# Patient Record
Sex: Male | Born: 1950
Health system: Southern US, Community
[De-identification: ages and names within clinical notes are randomized; demographics above are authoritative.]

## PROBLEM LIST (undated history)

## (undated) DIAGNOSIS — D699 Hemorrhagic condition, unspecified: Secondary | ICD-10-CM

## (undated) DIAGNOSIS — N400 Enlarged prostate without lower urinary tract symptoms: Secondary | ICD-10-CM

## (undated) DIAGNOSIS — Z9889 Other specified postprocedural states: Secondary | ICD-10-CM

## (undated) DIAGNOSIS — N200 Calculus of kidney: Secondary | ICD-10-CM

## (undated) DIAGNOSIS — N62 Hypertrophy of breast: Secondary | ICD-10-CM

## (undated) DIAGNOSIS — Z9289 Personal history of other medical treatment: Secondary | ICD-10-CM

## (undated) DIAGNOSIS — R112 Nausea with vomiting, unspecified: Secondary | ICD-10-CM

## (undated) DIAGNOSIS — I10 Essential (primary) hypertension: Secondary | ICD-10-CM

## (undated) DIAGNOSIS — E785 Hyperlipidemia, unspecified: Secondary | ICD-10-CM

## (undated) HISTORY — DX: Benign prostatic hyperplasia without lower urinary tract symptoms: N40.0

## (undated) HISTORY — DX: Hypertrophy of breast: N62

## (undated) HISTORY — DX: Calculus of kidney: N20.0

## (undated) HISTORY — DX: Personal history of other medical treatment: Z92.89

## (undated) HISTORY — DX: Essential (primary) hypertension: I10

## (undated) HISTORY — PX: CIRCUMCISION: SHX1350

## (undated) HISTORY — DX: Hyperlipidemia, unspecified: E78.5

## (undated) HISTORY — PX: MULTIPLE TOOTH EXTRACTIONS: SHX2053

---

## 1998-11-05 ENCOUNTER — Ambulatory Visit (HOSPITAL_BASED_OUTPATIENT_CLINIC_OR_DEPARTMENT_OTHER): Admission: RE | Admit: 1998-11-05 | Discharge: 1998-11-05 | Payer: Self-pay | Admitting: General Surgery

## 1999-12-01 ENCOUNTER — Encounter: Admission: RE | Admit: 1999-12-01 | Discharge: 1999-12-01 | Payer: Self-pay | Admitting: Family Medicine

## 1999-12-01 ENCOUNTER — Encounter: Payer: Self-pay | Admitting: Family Medicine

## 2009-03-03 ENCOUNTER — Encounter: Admission: RE | Admit: 2009-03-03 | Discharge: 2009-03-03 | Payer: Self-pay | Admitting: Family Medicine

## 2011-09-23 DIAGNOSIS — Z9289 Personal history of other medical treatment: Secondary | ICD-10-CM

## 2011-09-23 HISTORY — DX: Personal history of other medical treatment: Z92.89

## 2011-10-10 ENCOUNTER — Emergency Department (HOSPITAL_COMMUNITY)
Admission: EM | Admit: 2011-10-10 | Discharge: 2011-10-10 | Disposition: A | Discharge status: Home / Self Care | Payer: 59 | Attending: Emergency Medicine | Admitting: Emergency Medicine

## 2011-10-10 ENCOUNTER — Emergency Department (HOSPITAL_COMMUNITY): Payer: 59

## 2011-10-10 ENCOUNTER — Encounter (HOSPITAL_COMMUNITY): Payer: Self-pay | Admitting: Emergency Medicine

## 2011-10-10 DIAGNOSIS — N201 Calculus of ureter: Secondary | ICD-10-CM

## 2011-10-10 DIAGNOSIS — R109 Unspecified abdominal pain: Secondary | ICD-10-CM | POA: Insufficient documentation

## 2011-10-10 HISTORY — DX: Calculus of kidney: N20.0

## 2011-10-10 LAB — URINALYSIS, ROUTINE W REFLEX MICROSCOPIC
Glucose, UA: NEGATIVE mg/dL
Ketones, ur: 15 mg/dL — AB
Nitrite: NEGATIVE
Protein, ur: 100 mg/dL — AB
Specific Gravity, Urine: 1.031 — ABNORMAL HIGH (ref 1.005–1.030)
Urobilinogen, UA: 1 mg/dL (ref 0.0–1.0)
pH: 6 (ref 5.0–8.0)

## 2011-10-10 LAB — CBC
Hemoglobin: 12.9 g/dL — ABNORMAL LOW (ref 13.0–17.0)
MCH: 28.4 pg (ref 26.0–34.0)
Platelets: 262 10*3/uL (ref 150–400)
RBC: 4.54 MIL/uL (ref 4.22–5.81)
WBC: 9 10*3/uL (ref 4.0–10.5)

## 2011-10-10 LAB — URINE MICROSCOPIC-ADD ON

## 2011-10-10 LAB — BASIC METABOLIC PANEL
CO2: 24 mEq/L (ref 19–32)
Calcium: 9.2 mg/dL (ref 8.4–10.5)
Glucose, Bld: 140 mg/dL — ABNORMAL HIGH (ref 70–99)
Potassium: 3.5 mEq/L (ref 3.5–5.1)
Sodium: 139 mEq/L (ref 135–145)

## 2011-10-10 MED ORDER — ONDANSETRON 8 MG PO TBDP: 8.0000 mg | ORAL_TABLET | Freq: Three times a day (TID) | ORAL | Status: AC | PRN

## 2011-10-10 MED ORDER — KETOROLAC TROMETHAMINE 30 MG/ML IJ SOLN
30.0000 mg | Freq: Once | INTRAMUSCULAR | Status: AC
  Administered 2011-10-10: 30 mg via INTRAVENOUS
  Filled 2011-10-10: qty 1

## 2011-10-10 MED ORDER — SODIUM CHLORIDE 0.9 % IV BOLUS (SEPSIS)
1000.0000 mL | Freq: Once | INTRAVENOUS | Status: AC
  Administered 2011-10-10: 1000 mL via INTRAVENOUS

## 2011-10-10 MED ORDER — ONDANSETRON HCL 4 MG/2ML IJ SOLN
4.0000 mg | Freq: Once | INTRAMUSCULAR | Status: AC
  Administered 2011-10-10: 4 mg via INTRAVENOUS
  Filled 2011-10-10: qty 2

## 2011-10-10 MED ORDER — OXYCODONE-ACETAMINOPHEN 5-325 MG PO TABS: 1.0000 | ORAL_TABLET | ORAL | Status: AC | PRN

## 2011-10-10 MED ORDER — HYDROMORPHONE HCL PF 2 MG/ML IJ SOLN
2.0000 mg | Freq: Once | INTRAMUSCULAR | Status: AC
  Administered 2011-10-10: 2 mg via INTRAVENOUS
  Filled 2011-10-10: qty 1

## 2011-10-10 NOTE — ED Notes (Signed)
Resting quietly on stretcher - iv fluids infusing without difficulty; states pain now "tolerable" - awaiting CT scan results - pt aware of same

## 2011-10-10 NOTE — ED Notes (Signed)
Patient transported to CT 

## 2011-10-10 NOTE — ED Provider Notes (Signed)
History     CSN: [id number]  Arrival date & time 10/10/11  1610   First MD Initiated Contact with Patient 10/10/11 1659      Chief Complaint  Patient presents with  . Emesis  . Abdominal Pain     The history is provided by the patient.   the patient reports acute onset right-sided abdominal pain with radiation down towards his right groin his right testicle.  He denies testicular pain or penile discharge.  He reports nausea and vomiting without diarrhea.  He denies dysuria or urinary frequency.  He has had some urinary hesitancy today.  He denies fevers and chills.  His pain is moderate to severe at this time.  Nothing worsens the symptoms.  Nothing improves his symptoms.  Symptoms are constant.  Past Medical History  Diagnosis Date  . Kidney calculi   . Asthma     History reviewed. No pertinent past surgical history.  History reviewed. No pertinent family history.  History  Substance Use Topics  . Smoking status: Never Smoker   . Smokeless tobacco: Not on file  . Alcohol Use: Yes     occasionally      Review of Systems  Gastrointestinal: Positive for vomiting and abdominal pain.  All other systems reviewed and are negative.    Allergies  Shellfish allergy  Home Medications   Current Outpatient Rx  Name Route Sig Dispense Refill  . NAPROXEN SODIUM 220 MG PO TABS Oral Take 440 mg by mouth once.    Marland Kitchen PSEUDOEPHEDRINE-APAP-DM 44-010-27 MG/30ML PO LIQD Oral Take 30 mLs by mouth once.      BP 144/81  Pulse 74  Temp(Src) 98.6 F (37 C) (Oral)  Resp 20  SpO2 98%  Physical Exam  Nursing note and vitals reviewed. Constitutional: He is oriented to person, place, and time. He appears well-developed and well-nourished.  HENT:  Head: Normocephalic and atraumatic.  Eyes: EOM are normal.  Neck: Normal range of motion.  Cardiovascular: Normal rate, regular rhythm, normal heart sounds and intact distal pulses.   Pulmonary/Chest: Effort normal and breath sounds  normal. No respiratory distress.  Abdominal: Soft. He exhibits no distension. There is no tenderness.  Genitourinary:       No right CVA tenderness  Musculoskeletal: Normal range of motion.  Neurological: He is alert and oriented to person, place, and time.  Skin: Skin is warm and dry.  Psychiatric: He has a normal mood and affect. Judgment normal.    ED Course  Procedures (including critical care time)  Labs Reviewed  URINALYSIS, ROUTINE W REFLEX MICROSCOPIC - Abnormal; Notable for the following:    Color, Urine BROWN (*) BIOCHEMICALS MAY BE AFFECTED BY COLOR   APPearance TURBID (*)    Specific Gravity, Urine 1.031 (*)    Hgb urine dipstick LARGE (*)    Bilirubin Urine MODERATE (*)    Ketones, ur 15 (*)    Protein, ur 100 (*)    Leukocytes, UA SMALL (*)    All other components within normal limits  CBC - Abnormal; Notable for the following:    Hemoglobin 12.9 (*)    HCT 38.1 (*)    All other components within normal limits  BASIC METABOLIC PANEL - Abnormal; Notable for the following:    Glucose, Bld 140 (*)    GFR calc non Af Amer 74 (*)    GFR calc Af Amer 86 (*)    All other components within normal limits  URINE MICROSCOPIC-ADD ON - Abnormal;  Notable for the following:    Squamous Epithelial / LPF FEW (*)    Bacteria, UA FEW (*)    All other components within normal limits   Ct Abdomen Pelvis Wo Contrast  10/10/2011  *RADIOLOGY REPORT*  Clinical Data: Right flank and lower abdomen pain.  Right testicular pressure.  History of nephrolithiasis.  CT ABDOMEN AND PELVIS WITHOUT CONTRAST  Technique:  Multidetector CT imaging of the abdomen and pelvis was performed following the standard protocol without intravenous contrast.  Comparison: Renal ultrasound dated 03/03/2009.  CT report dated 12/01/1999.  Findings: Moderate dilatation of the right renal collecting system to the level of a 2 mm calculus in the proximal ureter.  2.6 cm upper pole right renal cyst.  Diffuse enlargement  of the right kidney compared to the left kidney with right perinephric soft tissue stranding.  Unremarkable left kidney, left ureter and urinary bladder.  Mildly enlarged prostate gland.  Normal appearing adrenal glands.  Unremarkable noncontrasted appearance of the liver, spleen, pancreas and gallbladder.  No gastrointestinal abnormalities or enlarged lymph nodes.  Normal appearing appendix in the upper right pelvis.  Clear lung bases.  Mild lumbar and lower thoracic spine degenerative changes.  IMPRESSION:  1.  2 mm proximal right ureteral calculus, causing moderate right hydronephrosis. 2.  Obstructive changes involving the right kidney. 3.  2.6 cm upper pole right renal cyst.  Original Report Authenticated By: Darrol Angel, M.D.   I personally reviewed the CT scan  1. Right ureteral stone       MDM  Patient with right-sided proximal UVJ stone.  His urine does not appear infected.  His kidney function is normal.  At this time 7:25 PM his pain is much better.  DC home with urology followup.         Lyanne Co, MD 10/10/11 [phone]

## 2011-10-10 NOTE — ED Notes (Signed)
Patient transported from CT 

## 2011-10-10 NOTE — Discharge Instructions (Signed)
Ureteral Colic (Kidney Stones) Ureteral colic is the result of a condition when kidney stones form inside the kidney. Once kidney stones are formed they may move into the tube that connects the kidney with the bladder (ureter). If this occurs, this condition may cause pain (colic) in the ureter.  CAUSES  Pain is caused by stone movement in the ureter and the obstruction caused by the stone. SYMPTOMS  The pain comes and goes as the ureter contracts around the stone. The pain is usually intense, sharp, and stabbing in character. The location of the pain may move as the stone moves through the ureter. When the stone is near the kidney the pain is usually located in the back and radiates to the belly (abdomen). When the stone is ready to pass into the bladder the pain is often located in the lower abdomen on the side the stone is located. At this location, the symptoms may mimic those of a urinary tract infection with urinary frequency. Once the stone is located here it often passes into the bladder and the pain disappears completely. TREATMENT   Your caregiver will provide you with medicine for pain relief.   You may require specialized follow-up X-rays.   The absence of pain does not always mean that the stone has passed. It may have just stopped moving. If the urine remains completely obstructed, it can cause loss of kidney function or even complete destruction of the involved kidney. It is your responsibility and in your interest that X-rays and follow-ups as suggested by your caregiver are completed. Relief of pain without passage of the stone can be associated with severe damage to the kidney, including loss of kidney function on that side.   If your stone does not pass on its own, additional measures may be taken by your caregiver to ensure its removal.  HOME CARE INSTRUCTIONS   Increase your fluid intake. Water is the preferred fluid since juices containing vitamin C may acidify the urine making  it less likely for certain stones (uric acid stones) to pass.   Strain all urine. A strainer will be provided. Keep all particulate matter or stones for your caregiver to inspect.   Take your pain medicine as directed.   Make a follow-up appointment with your caregiver as directed.   Remember that the goal is passage of your stone. The absence of pain does not mean the stone is gone. Follow your caregiver's instructions.   Only take over-the-counter or prescription medicines for pain, discomfort, or fever as directed by your caregiver.  SEEK MEDICAL CARE IF:   Pain cannot be controlled with the prescribed medicine.   You have a fever.   Pain continues for longer than your caregiver advises it should.   There is a change in the pain, and you develop chest discomfort or constant abdominal pain.   You feel faint or pass out.  MAKE SURE YOU:   Understand these instructions.   Will watch your condition.   Will get help right away if you are not doing well or get worse.  Document Released: 04/20/2005 Document Revised: 06/30/2011 Document Reviewed: 01/05/2011 Exit[employer] Patient Information 2012 [employer]. 

## 2011-10-10 NOTE — ED Notes (Signed)
Reports previous hx of kidney stones - states this pain similar to last stone

## 2011-10-10 NOTE — ED Notes (Signed)
Pt to ED for eval of vomiting, abd pain, pt c/o R testicle pain; has not been able to void since 0600 AM; mid to R abd pain;

## 2011-10-24 DIAGNOSIS — Z9289 Personal history of other medical treatment: Secondary | ICD-10-CM

## 2011-10-24 HISTORY — DX: Personal history of other medical treatment: Z92.89

## 2013-12-11 ENCOUNTER — Encounter: Payer: Self-pay | Admitting: Cardiology

## 2013-12-11 DIAGNOSIS — I10 Essential (primary) hypertension: Secondary | ICD-10-CM | POA: Insufficient documentation

## 2013-12-11 DIAGNOSIS — R011 Cardiac murmur, unspecified: Secondary | ICD-10-CM

## 2014-01-02 ENCOUNTER — Other Ambulatory Visit: Payer: Self-pay | Admitting: Interventional Cardiology

## 2014-02-17 ENCOUNTER — Ambulatory Visit (INDEPENDENT_AMBULATORY_CARE_PROVIDER_SITE_OTHER): Payer: 59 | Admitting: Interventional Cardiology

## 2014-02-17 ENCOUNTER — Encounter: Payer: Self-pay | Admitting: Interventional Cardiology

## 2014-02-17 VITALS — BP 123/72 | HR 67 | Ht 70.0 in | Wt 169.4 lb

## 2014-02-17 DIAGNOSIS — I10 Essential (primary) hypertension: Secondary | ICD-10-CM

## 2014-02-17 MED ORDER — AMLODIPINE BESYLATE 2.5 MG PO TABS: ORAL_TABLET | ORAL | Status: DC

## 2014-02-17 NOTE — Patient Instructions (Signed)
Your physician recommends that you continue on your current medications as directed. Please refer to the Current Medication list given to you today.  Amlodipine 2.5mg  has been refilled today with 90day supply  Your physician wants you to follow-up in: 1 year with Dr.Smith You will receive a reminder letter in the mail two months in advance. If you don't receive a letter, please call our office to schedule the follow-up appointment.

## 2014-02-17 NOTE — Progress Notes (Signed)
Patient ID: Jonathan Ramirez, male   DOB: July 13, 1951, 63 y.o.   MRN: [id number]    [address] [place], [place]  [id number] Phone: [phone] Fax:  [phone]  Date:  02/17/2014   ID:  Jonathan Ramirez, DOB July 13, 1951, MRN [id number]  PCP:  Maryelizabeth RowanEWEY,ELIZABETH, MD   ASSESSMENT:  1. Hypertension, controlled 2. Systolic flow murmur, physiologic murmur   PLAN:  1. No change in the current regimen of aerobic activity, salt restriction, and weight control. 2. Clinical followup in one year   SUBJECTIVE: Jonathan Ramirez is a 63 y.o. male who is doing well. He denies any cardiopulmonary complaints. He continues to enjoy bicycling, he just completed a tour across [place]. He does some competitive cycling.Marland Kitchen. He denies chest discomfort. No excessive dyspnea. No palpitations or syncope. No medication side effects.  Wt Readings from Last 3 Encounters:  02/17/14 169 lb 6.4 oz (76.839 kg)     Past Medical History  Diagnosis Date  . Kidney calculi   . Asthma   . Hyperlipidemia   . Hypertension   . BPH (benign prostatic hyperplasia)   . Nephrolithiasis   . Gynecomastia     eval by Dr. Abbey Chattersosenbower  . History of echocardiogram 3/13    no significant abn  . History of stress test 4/13    GXT- negative for ischemia    Current Outpatient Prescriptions  Medication Sig Dispense Refill  . amLODipine (NORVASC) 2.5 MG tablet take 1 tablet by mouth once daily  30 tablet  1  . tamsulosin (FLOMAX) 0.4 MG CAPS capsule Take 0.4 mg by mouth daily after supper.       No current facility-administered medications for this visit.    Allergies:    Allergies  Allergen Reactions  . Shellfish Allergy     Social History:  The patient  reports that he has never smoked. He does not have any smokeless tobacco history on file. He reports that he drinks alcohol. He reports that he does not use illicit drugs.   ROS:  Please see the history of present illness.      All other systems reviewed and negative.    OBJECTIVE: VS:  BP 123/72  Pulse 67  Ht 5\' 10"  (1.778 m)  Wt 169 lb 6.4 oz (76.839 kg)  BMI 24.31 kg/m2 Well nourished, well developed, in no acute distress, stable without obvious debility HEENT: normal Neck: JVD flat. Carotid bruit absent  Cardiac:  normal S1, S2; RRR; no murmur Lungs:  clear to auscultation bilaterally, no wheezing, rhonchi or rales Abd: soft, nontender, no hepatomegaly Ext: Edema absent. Pulses 2+ and symmetric Skin: warm and dry Neuro:  CNs 2-12 intact, no focal abnormalities noted  EKG:  Normal with vertical axis and prominent voltage. The patient is most likely represents physiologic hypertrophy. Blood pressure is under excellent control.       Signed, Darci NeedleHenry W. B. Daleiza Bacchi III, MD 02/17/2014 2:01 PM

## 2014-04-02 ENCOUNTER — Other Ambulatory Visit: Payer: Self-pay

## 2014-04-02 DIAGNOSIS — I10 Essential (primary) hypertension: Secondary | ICD-10-CM

## 2014-04-02 MED ORDER — AMLODIPINE BESYLATE 2.5 MG PO TABS: ORAL_TABLET | ORAL | Status: DC

## 2014-07-10 ENCOUNTER — Encounter: Payer: Self-pay | Admitting: Hematology & Oncology

## 2014-07-11 ENCOUNTER — Telehealth: Payer: Self-pay | Admitting: Hematology & Oncology

## 2014-07-11 NOTE — Telephone Encounter (Signed)
I spoke w NEW PATIENT today to remind them of their appointment with Dr. Ennever. Also, advised them to bring all medication bottles and insurance card information. ° °

## 2014-07-14 ENCOUNTER — Encounter: Payer: Self-pay | Admitting: Family

## 2014-07-14 ENCOUNTER — Ambulatory Visit: Payer: 59

## 2014-07-14 ENCOUNTER — Ambulatory Visit (HOSPITAL_BASED_OUTPATIENT_CLINIC_OR_DEPARTMENT_OTHER): Payer: 59 | Admitting: Lab

## 2014-07-14 ENCOUNTER — Ambulatory Visit (HOSPITAL_BASED_OUTPATIENT_CLINIC_OR_DEPARTMENT_OTHER): Payer: 59 | Admitting: Family

## 2014-07-14 VITALS — BP 127/68 | HR 70 | Temp 98.1°F | Resp 18 | Ht 67.0 in | Wt 176.0 lb

## 2014-07-14 DIAGNOSIS — Z862 Personal history of diseases of the blood and blood-forming organs and certain disorders involving the immune mechanism: Secondary | ICD-10-CM

## 2014-07-14 DIAGNOSIS — R972 Elevated prostate specific antigen [PSA]: Secondary | ICD-10-CM

## 2014-07-14 LAB — CBC WITH DIFFERENTIAL (CANCER CENTER ONLY)
BASO#: 0 10*3/uL (ref 0.0–0.2)
BASO%: 0.3 % (ref 0.0–2.0)
EOS ABS: 0.4 10*3/uL (ref 0.0–0.5)
EOS%: 6.4 % (ref 0.0–7.0)
HCT: 41.7 % (ref 38.7–49.9)
HEMOGLOBIN: 14.1 g/dL (ref 13.0–17.1)
LYMPH#: 1.3 10*3/uL (ref 0.9–3.3)
LYMPH%: 22.3 % (ref 14.0–48.0)
MCH: 29 pg (ref 28.0–33.4)
MCHC: 33.8 g/dL (ref 32.0–35.9)
MCV: 86 fL (ref 82–98)
MONO#: 0.6 10*3/uL (ref 0.1–0.9)
MONO%: 10.3 % (ref 0.0–13.0)
NEUT%: 60.7 % (ref 40.0–80.0)
NEUTROS ABS: 3.5 10*3/uL (ref 1.5–6.5)
PLATELETS: 235 10*3/uL (ref 145–400)
RBC: 4.87 10*6/uL (ref 4.20–5.70)
RDW: 12.9 % (ref 11.1–15.7)
WBC: 5.7 10*3/uL (ref 4.0–10.0)

## 2014-07-14 NOTE — Progress Notes (Signed)
Hematology/Oncology Consultation   Name: Windle Huebert      MRN: [id number]    Location: Room/bed info not found  Date: 07/14/2014 Time:1:30 PM   REFERRING PHYSICIAN: Jeannett Senior Dahlstedt  REASON FOR CONSULT: Pt states that he has a "bleeding disorder." Needs prostate ultrasound and biopsy but is worried about bleeding.    DIAGNOSIS: Awaiting lab results  HISTORY OF PRESENT ILLNESS: Mr. Hilyard is a very pleasant 63 yo African American male with an elevated PSA (5.78). He needs a prostate ultrasound and biopsy done but he has a history of bleeding that has his Dr concerned.  He was circumcised when he was 63 yo and bled profusely for several days required multiple transfusions. He was hospitalized for 2 weeks. He was followed monthly by Kendell Bane for 1 year. He was given a medication (but does not remember what it was) That caused the enamel to come off of his teeth. He then had all but 6 of his teeth pulled and bled again. He was in the hospital for 2 weeks but does not remember being transfused that time. He has had no other surgeries. He states that if he cuts himself it does take him a little while to clot.  None of his family has any bleeding or clotting disorder or sickle cell. His mother had colon cancer. 2 of his sisters had ovarian cancer and one has since passed away. His paternal grandfather and 2 uncles all passed away from prostate cancer. He has had an enlarged prostate for almost 20 years. He developed kidney stones last year and his urologist placed him on Fosamax.  He is on Norvasc for hypertension.  He is a bicycle rider and rides often in an organized league. He really enjoys this.  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 He served in [place] in the late 1960's.  He does not smoke and only drinks occassionally.  His appetite is good and he stays hydrated. His weight is stable.    He denies fever, chills, n/v, cough, rash, headache, dizziness, SOB, chest  pain, palpitations, abdominal pain, constipation, diarrhea, blood in urine or stool.  No swelling, tenderness, numbness or tingling in his extremities.   ROS: All other 10 point review of systems is negative.   PAST MEDICAL HISTORY:   Past Medical History  Diagnosis Date  . Kidney calculi   . Asthma   . Hyperlipidemia   . Hypertension   . BPH (benign prostatic hyperplasia)   . Nephrolithiasis   . Gynecomastia     eval by Dr. Abbey Chatters  . History of echocardiogram 3/13    no significant abn  . History of stress test 4/13    GXT- negative for ischemia    ALLERGIES: Allergies  Allergen Reactions  . Shellfish Allergy       MEDICATIONS:  Current Outpatient Prescriptions on File Prior to Visit  Medication Sig Dispense Refill  . amLODipine (NORVASC) 2.5 MG tablet take 1 tablet by mouth once daily 90 tablet 3  . tamsulosin (FLOMAX) 0.4 MG CAPS capsule Take 0.4 mg by mouth daily after supper.     No current facility-administered medications on file prior to visit.     PAST SURGICAL HISTORY No past surgical history on file.  FAMILY HISTORY: Family History  Problem Relation Age of Onset  . CAD Father     stent  . COPD Father   . Colon cancer Mother   . Heart disease Mother  SOCIAL HISTORY:  reports that he has never smoked. He does not have any smokeless tobacco history on file. He reports that he drinks alcohol. He reports that he does not use illicit drugs.  PERFORMANCE STATUS: The patient's performance status is 0 - Asymptomatic  PHYSICAL EXAM: Most Recent Vital Signs: There were no vitals taken for this visit. BP 127/68 mmHg  Pulse 70  Temp(Src) 98.1 F (36.7 C) (Oral)  Resp 18  Ht 5\' 7"  (1.702 m)  Wt 176 lb (79.833 kg)  BMI 27.56 kg/m2  General Appearance:    Alert, cooperative, no distress, appears stated age  Head:    Normocephalic, without obvious abnormality, atraumatic  Eyes:    PERRL, conjunctiva/corneas clear, EOM's intact, fundi    benign,  both eyes             Throat:   Lips, mucosa, and tongue normal; teeth and gums normal  Neck:   Supple, symmetrical, trachea midline, no adenopathy;       thyroid:  No enlargement/tenderness/nodules; no carotid   bruit or JVD  Back:     Symmetric, no curvature, ROM normal, no CVA tenderness  Lungs:     Clear to auscultation bilaterally, respirations unlabored  Chest wall:    No tenderness or deformity  Heart:    Regular rate and rhythm, S1 and S2 normal, no murmur, rub   or gallop  Abdomen:     Soft, non-tender, bowel sounds active all four quadrants,    no masses, no organomegaly        Extremities:   Extremities normal, atraumatic, no cyanosis or edema  Pulses:   2+ and symmetric all extremities  Skin:   Skin color, texture, turgor normal, no rashes or lesions  Lymph nodes:   Cervical, supraclavicular, and axillary nodes normal  Neurologic:   CNII-XII intact. Normal strength, sensation and reflexes      throughout    LABORATORY DATA:  Results for orders placed or performed in visit on 07/14/14 (from the past 48 hour(s))  CBC with Differential [facility]     Status: None   Collection Time: 07/14/14  1:00 PM  Result Value Ref Range   WBC 5.7 4.0 - 10.0 10e3/uL   RBC 4.87 4.20 - 5.70 10e6/uL   HGB 14.1 13.0 - 17.1 g/dL   HCT 16.141.7 09.638.7 - 04.549.9 %   MCV 86 82 - 98 fL   MCH 29.0 28.0 - 33.4 pg   MCHC 33.8 32.0 - 35.9 g/dL   RDW 40.912.9 81.111.1 - 91.415.7 %   Platelets 235 145 - 400 10e3/uL   NEUT# 3.5 1.5 - 6.5 10e3/uL   LYMPH# 1.3 0.9 - 3.3 10e3/uL   MONO# 0.6 0.1 - 0.9 10e3/uL   Eosinophils Absolute 0.4 0.0 - 0.5 10e3/uL   BASO# 0.0 0.0 - 0.2 10e3/uL   NEUT% 60.7 40.0 - 80.0 %   LYMPH% 22.3 14.0 - 48.0 %   MONO% 10.3 0.0 - 13.0 %   EOS% 6.4 0.0 - 7.0 %   BASO% 0.3 0.0 - 2.0 %      RADIOGRAPHY: No results found.     PATHOLOGY: None  ASSESSMENT/PLAN: [name] is a very pleasant 63 yo African American male with an elevated PSA (5.78). He needs a prostate ultrasound and  biopsy done but he has a history of bleeding that has his urologist concerned.  His CBC today was normal. Platelets 235. He has had no bleeding.  We will see what the  rest of his labs show.  We will contact him with the results so urology will know how to proceed with the biopsy.   All questions were answered. He knows to call the clinic with any problems, questions or concerns. We can certainly see him much sooner if necessary.  The patient was discussed with and also seen by Dr. Myna HidalgoEnnever and he is in agreement with the aforementioned.   [facility],Kryssa Risenhoover M    Addendum:    I saw and examined the patient with Miller Edgington. It is very hard to tell whether or not there are any coagulation issues. Unfortunately, we don't have a lot to go on.  I am very interested in the fact that despite him having this bleeding issue when he was younger, he still made into [employer].  He does not bruise. He does not have any spontaneous bleeding.  Under the microscope, his platelets look okay. They are well granulated. He has normal white cells and red cells.  There is no family history of bleeding.  He has had some teeth removed. From what he tells me, there was no bleeding that was excessive.  He does have a piercing into his nose. There was no bleeding with this.  It is possible that he may have von Willebrand's disease. We will have to await the results of his coagulation studies.  I would not think that he is hemophiliac. I would think if he had this, he would not have made it into [employer] service [place] in [place].  We want to make sure that we are aggressive in identifying any bleeding disorder. With prostate biopsies, bleeding can certainly be quite brisk and we will want to make sure that bleeding is a minimum because of the need for accurate biopsy specimens.  We will certainly let Dr. Lattie Hawallstedt know what is going on so that he can plan for the biopsy. I think the biopsy will be done in  January.  [name] is a very interesting man. It was found talking to him. We spent about 45 minutes with him.  Cindee LamePete

## 2014-07-19 LAB — PROTHROMBIN TIME
INR: 1.39 (ref <1.50)
Prothrombin Time: 17.1 seconds — ABNORMAL HIGH (ref 11.6–15.2)

## 2014-07-19 LAB — VON WILLEBRAND PANEL
COAGULATION FACTOR VIII: 57 % — AB (ref 73–140)
Ristocetin Co-factor, Plasma: 93 % (ref 42–200)
Von Willebrand Antigen, Plasma: 152 % (ref 50–217)

## 2014-07-19 LAB — APTT: APTT: 41 s — AB (ref 24–37)

## 2014-07-22 ENCOUNTER — Telehealth: Payer: Self-pay | Admitting: Hematology & Oncology

## 2014-07-22 ENCOUNTER — Other Ambulatory Visit: Payer: Self-pay | Admitting: Family

## 2014-07-22 DIAGNOSIS — D699 Hemorrhagic condition, unspecified: Secondary | ICD-10-CM

## 2014-07-22 NOTE — Telephone Encounter (Signed)
Patient called back and sch apt for 07/23/14

## 2014-07-22 NOTE — Telephone Encounter (Signed)
Per order to sch patient for lab apt in one week.  I called patient today and left a message for patient to call office back to sch labs.

## 2014-07-23 ENCOUNTER — Ambulatory Visit (HOSPITAL_BASED_OUTPATIENT_CLINIC_OR_DEPARTMENT_OTHER): Payer: 59 | Admitting: Lab

## 2014-07-23 DIAGNOSIS — R972 Elevated prostate specific antigen [PSA]: Secondary | ICD-10-CM

## 2014-07-23 DIAGNOSIS — D699 Hemorrhagic condition, unspecified: Secondary | ICD-10-CM

## 2014-07-30 LAB — FACTOR 10 ASSAY: FACTOR X ACTIVITY: 98 % (ref 72–134)

## 2014-07-30 LAB — MIXING STUDY DILUTIONS, PT
PATIENT-4/1, IMMEDIATE MIX-PT: 14.4 s
Patient-1/1, Immediate Mix-PT: 13.9 seconds

## 2014-07-30 LAB — PTT FACTOR INHIBITOR (MIXING STUDY): PTT: 34 s (ref 24–37)

## 2014-07-30 LAB — PT FACTOR INHIBITOR (MIXING STUDY): Protime: 15.3 seconds — ABNORMAL HIGH (ref 11.6–15.2)

## 2014-08-04 ENCOUNTER — Telehealth: Payer: Self-pay | Admitting: Hematology & Oncology

## 2014-08-04 NOTE — Telephone Encounter (Signed)
Pt called was calling messages left on phone from December. He did get lab from 12-30, but never got the results. I transferred to RN vm.

## 2014-08-06 ENCOUNTER — Other Ambulatory Visit: Payer: Self-pay | Admitting: Hematology & Oncology

## 2014-08-06 DIAGNOSIS — D689 Coagulation defect, unspecified: Secondary | ICD-10-CM

## 2014-08-07 ENCOUNTER — Telehealth: Payer: Self-pay | Admitting: Hematology & Oncology

## 2014-08-07 NOTE — Telephone Encounter (Signed)
Left pt message with 2-3 appointment

## 2014-08-13 ENCOUNTER — Ambulatory Visit: Payer: Self-pay | Admitting: Urology

## 2014-08-13 DIAGNOSIS — R972 Elevated prostate specific antigen [PSA]: Secondary | ICD-10-CM | POA: Diagnosis not present

## 2014-08-13 DIAGNOSIS — D689 Coagulation defect, unspecified: Secondary | ICD-10-CM | POA: Diagnosis present

## 2014-08-15 ENCOUNTER — Other Ambulatory Visit: Payer: Self-pay | Admitting: Urology

## 2014-08-15 ENCOUNTER — Other Ambulatory Visit (HOSPITAL_COMMUNITY)
Admission: RE | Admit: 2014-08-15 | Discharge: 2014-08-15 | Disposition: A | Discharge status: Home / Self Care | Payer: Medicare Other | Source: Ambulatory Visit | Attending: Hematology & Oncology | Admitting: Hematology & Oncology

## 2014-08-15 DIAGNOSIS — D689 Coagulation defect, unspecified: Secondary | ICD-10-CM | POA: Diagnosis present

## 2014-08-15 DIAGNOSIS — Z7901 Long term (current) use of anticoagulants: Secondary | ICD-10-CM | POA: Insufficient documentation

## 2014-08-15 LAB — ABO/RH
ABO/RH(D): O NEG
ABO/RH(D): O NEG

## 2014-08-18 ENCOUNTER — Encounter (HOSPITAL_COMMUNITY): Payer: Self-pay

## 2014-08-18 ENCOUNTER — Ambulatory Visit (HOSPITAL_COMMUNITY)
Admission: RE | Admit: 2014-08-18 | Discharge: 2014-08-18 | Disposition: A | Discharge status: Home / Self Care | Payer: Medicare Other | Source: Ambulatory Visit | Attending: Urology | Admitting: Urology

## 2014-08-18 DIAGNOSIS — R972 Elevated prostate specific antigen [PSA]: Secondary | ICD-10-CM | POA: Insufficient documentation

## 2014-08-18 DIAGNOSIS — D689 Coagulation defect, unspecified: Secondary | ICD-10-CM | POA: Insufficient documentation

## 2014-08-18 HISTORY — DX: Nausea with vomiting, unspecified: R11.2

## 2014-08-18 HISTORY — DX: Hemorrhagic condition, unspecified: D69.9

## 2014-08-18 HISTORY — DX: Other specified postprocedural states: Z98.890

## 2014-08-18 MED ORDER — SODIUM CHLORIDE 0.9 % IV SOLN
Freq: Once | INTRAVENOUS | Status: AC
  Administered 2014-08-18: 12:00:00 via INTRAVENOUS

## 2014-08-18 NOTE — Discharge Instructions (Signed)
Follow Pink Blood Product Sheet. Remember to notify MD of Fever over 100.5, chills, Rash,Hives, Shortness of breath or other problems

## 2014-08-19 LAB — PREPARE FRESH FROZEN PLASMA
UNIT DIVISION: 0
Unit division: 0

## 2014-08-27 ENCOUNTER — Other Ambulatory Visit (HOSPITAL_BASED_OUTPATIENT_CLINIC_OR_DEPARTMENT_OTHER): Payer: Medicare Other | Admitting: Lab

## 2014-08-27 ENCOUNTER — Encounter: Payer: Self-pay | Admitting: Hematology & Oncology

## 2014-08-27 ENCOUNTER — Ambulatory Visit (HOSPITAL_BASED_OUTPATIENT_CLINIC_OR_DEPARTMENT_OTHER): Payer: Medicare Other | Admitting: Hematology & Oncology

## 2014-08-27 VITALS — BP 126/69 | HR 64 | Temp 98.2°F | Resp 18 | Ht 69.0 in | Wt 179.0 lb

## 2014-08-27 DIAGNOSIS — D689 Coagulation defect, unspecified: Secondary | ICD-10-CM

## 2014-08-27 DIAGNOSIS — Z862 Personal history of diseases of the blood and blood-forming organs and certain disorders involving the immune mechanism: Secondary | ICD-10-CM

## 2014-08-27 LAB — CBC WITH DIFFERENTIAL (CANCER CENTER ONLY)
BASO#: 0 10*3/uL (ref 0.0–0.2)
BASO%: 0.8 % (ref 0.0–2.0)
EOS%: 8 % — ABNORMAL HIGH (ref 0.0–7.0)
Eosinophils Absolute: 0.4 10*3/uL (ref 0.0–0.5)
HCT: 41.3 % (ref 38.7–49.9)
HGB: 14 g/dL (ref 13.0–17.1)
LYMPH#: 1.1 10*3/uL (ref 0.9–3.3)
LYMPH%: 22.8 % (ref 14.0–48.0)
MCH: 28.9 pg (ref 28.0–33.4)
MCHC: 33.9 g/dL (ref 32.0–35.9)
MCV: 85 fL (ref 82–98)
MONO#: 0.6 10*3/uL (ref 0.1–0.9)
MONO%: 11.6 % (ref 0.0–13.0)
NEUT#: 2.8 10*3/uL (ref 1.5–6.5)
NEUT%: 56.8 % (ref 40.0–80.0)
PLATELETS: 256 10*3/uL (ref 145–400)
RBC: 4.84 10*6/uL (ref 4.20–5.70)
RDW: 13.2 % (ref 11.1–15.7)
WBC: 5 10*3/uL (ref 4.0–10.0)

## 2014-08-27 NOTE — Progress Notes (Signed)
Hematology and Oncology Follow Up Visit  Jonathan ColeDelacy Ramirez [id number] 04-Jul-1951 64 y.o. 08/27/2014   Principle Diagnosis:   Coagulopathy- undefined  Current Therapy:    Observation     Interim History:  Mr.  Jonathan Ramirez is back for follow-up. We saw him initially because of the history of bleeding. He was to have a prostate biopsy because the possibility of prostate cancer.  We first saw him, his pro time was elevated. His PTT was slightly elevated.  His factor VIII level was minimally depressed at 57%.  We did do a mixing study on him. The mixing study normalized. As such, he may have had a factor deficiency.  His factor X level was 98%.  I spoke to his urologist, Dr. Retta Dionesahlstedt, and told him that he probably would need some FFP prior to the biopsy.  He had a prostate biopsy done on January 25. He got 2 units of FFP. There is very little bleeding.  Thank you, his prostate biopsy came back negative. He had 1 out of 12 biopsies which showed a high grade intraepithelial neoplasm. This was not prostate cancer.  He does not have to go back to see Dr. Retta Dionesahlstedt until July.  He is still working. He is not having any bleeding issues. He wants to go on a bike ride in [place] in March.  Medications:  Current outpatient prescriptions:  .  amLODipine (NORVASC) 2.5 MG tablet, take 1 tablet by mouth once daily, Disp: 90 tablet, Rfl: 3 .  Multiple Vitamins-Minerals (MULTIVITAMIN WITH MINERALS) tablet, Take 1 tablet by mouth daily., Disp: , Rfl:  .  tamsulosin (FLOMAX) 0.4 MG CAPS capsule, Take 0.4 mg by mouth daily after supper., Disp: , Rfl:   Allergies:  Allergies  Allergen Reactions  . Shellfish Allergy     Past Medical History, Surgical history, Social history, and Family History were reviewed and updated.  Review of Systems: As above  Physical Exam:  height is 5\' 9"  (1.753 m) and weight is 179 lb (81.194 kg). His oral temperature is 98.2 F (36.8 C). His blood pressure is 126/69  and his pulse is 64. His respiration is 18.   Well-developed and well-nourished white gentleman in no obvious distress. Head and neck exam shows no ocular or oral lesions. There are no palpable cervical or supraclavicular lymph nodes. Lungs are clear. Cardiac exam regular rate and rhythm with no murmurs, rubs or bruits. Abdomen is soft. He has good bowel sounds. There is no fluid wave. There is no palpable liver or spleen tip. Back exam shows no tenderness over the spine, ribs or hips. Extremities shows no clubbing, cyanosis or edema. He has good range of motion of his joints. Neurological exam shows no focal neurological deficits. Skin exam no rashes, ecchymoses or petechia.  Lab Results  Component Value Date   WBC 5.0 08/27/2014   HGB 14.0 08/27/2014   HCT 41.3 08/27/2014   MCV 85 08/27/2014   PLT 256 08/27/2014     Chemistry      Component Value Date/Time   NA 139 10/10/2011 1712   K 3.5 10/10/2011 1712   CL 103 10/10/2011 1712   CO2 24 10/10/2011 1712   BUN 12 10/10/2011 1712   CREATININE 1.05 10/10/2011 1712      Component Value Date/Time   CALCIUM 9.2 10/10/2011 1712         Impression and Plan: Mr. Jonathan Ramirez is 64 year old gentleman. He has a bleeding disorder. So far, are studies have been inconclusive. He did  have his prostate biopsy without any bleeding secondary to receiving fresh frozen plasma prior to the procedure.  Since he is not planning on having any additional biopsies right now, I think that we can hold off on seeing him back.  I don't see that we have to put him through any additional studies for this coagulopathy. He is asymptomatic with this unless he needs to procedure.   I told him that if he were to have another procedure, to let us know so we can work on making sure that any bleeding will be minimal.  I spent about 30 minutes with him today. I went over his coagulation studies. I went over his biopsy report with him.    Jonathan Macho,  MD 2/3/20162:25 PM

## 2014-08-28 ENCOUNTER — Encounter: Payer: Self-pay | Admitting: Hematology & Oncology

## 2014-08-28 ENCOUNTER — Encounter: Payer: Self-pay | Admitting: *Deleted

## 2014-08-28 LAB — APTT: APTT: 32 s (ref 24–37)

## 2014-08-28 LAB — PROTHROMBIN TIME
INR: 1.15 (ref <1.50)
Prothrombin Time: 14.7 seconds (ref 11.6–15.2)

## 2014-09-01 LAB — VON WILLEBRAND PANEL
Coagulation Factor VIII: 118 % (ref 73–140)
Ristocetin Co-factor, Plasma: 115 % (ref 42–200)
Von Willebrand Antigen, Plasma: 172 % (ref 50–217)

## 2014-09-01 LAB — FACTOR 7 ASSAY: Factor VII Activity: 88 % (ref 60–150)

## 2015-01-22 ENCOUNTER — Other Ambulatory Visit: Payer: Self-pay | Admitting: Family Medicine

## 2015-01-22 ENCOUNTER — Ambulatory Visit
Admission: RE | Admit: 2015-01-22 | Discharge: 2015-01-22 | Disposition: A | Discharge status: Home / Self Care | Payer: Commercial Managed Care - HMO | Source: Ambulatory Visit | Attending: Family Medicine | Admitting: Family Medicine

## 2015-01-22 DIAGNOSIS — M545 Low back pain: Secondary | ICD-10-CM

## 2015-01-29 ENCOUNTER — Other Ambulatory Visit: Payer: Self-pay | Admitting: Family Medicine

## 2015-01-29 DIAGNOSIS — M5410 Radiculopathy, site unspecified: Secondary | ICD-10-CM

## 2015-01-29 DIAGNOSIS — M545 Low back pain: Secondary | ICD-10-CM

## 2015-02-11 ENCOUNTER — Ambulatory Visit
Admission: RE | Admit: 2015-02-11 | Discharge: 2015-02-11 | Disposition: A | Discharge status: Home / Self Care | Payer: Commercial Managed Care - HMO | Source: Ambulatory Visit | Attending: Family Medicine | Admitting: Family Medicine

## 2015-02-11 DIAGNOSIS — M545 Low back pain: Secondary | ICD-10-CM

## 2015-02-11 DIAGNOSIS — M5410 Radiculopathy, site unspecified: Secondary | ICD-10-CM

## 2015-02-24 ENCOUNTER — Ambulatory Visit: Payer: Medicare Other | Admitting: Interventional Cardiology

## 2015-04-09 ENCOUNTER — Ambulatory Visit: Payer: Commercial Managed Care - HMO | Admitting: Interventional Cardiology

## 2015-04-10 ENCOUNTER — Ambulatory Visit (INDEPENDENT_AMBULATORY_CARE_PROVIDER_SITE_OTHER): Payer: Commercial Managed Care - HMO | Admitting: Interventional Cardiology

## 2015-04-10 ENCOUNTER — Encounter: Payer: Self-pay | Admitting: Interventional Cardiology

## 2015-04-10 VITALS — BP 116/76 | HR 55 | Ht 69.5 in | Wt 170.0 lb

## 2015-04-10 DIAGNOSIS — I10 Essential (primary) hypertension: Secondary | ICD-10-CM | POA: Diagnosis not present

## 2015-04-10 NOTE — Patient Instructions (Signed)
Medication Instructions:  STOP Amlodipine  Labwork: None ordered  Testing/Procedures: None orderd  Follow-Up: Your physician wants you to follow-up in: 1 year with Dr.Smith You will receive a reminder letter in the mail two months in advance. If you don't receive a letter, please call our office to schedule the follow-up appointment.   Any Other Special Instructions Will Be Listed Below (If Applicable). Measure your blood pressure once a week for 6 weeks, please call the office with your readings.

## 2015-04-10 NOTE — Progress Notes (Signed)
Cardiology Office Note   Date:  04/10/2015   ID:  Jonathan Ramirez, DOB 10-29-1950, MRN [id number]  PCP:  Maryelizabeth Rowan, MD  Cardiologist:  Lesleigh Noe, MD   Chief Complaint  Patient presents with  . Hypertension      History of Present Illness: Jonathan Ramirez is a 64 y.o. male who presents for hypertension. On amlodipine without complications. No complaints.    Past Medical History  Diagnosis Date  . Kidney calculi   . Asthma   . Hyperlipidemia   . Hypertension   . BPH (benign prostatic hyperplasia)   . Nephrolithiasis   . Gynecomastia     eval by Dr. Abbey Chatters  . History of echocardiogram 3/13    no significant abn  . History of stress test 4/13    GXT- negative for ischemia  . Bleeding disorder     noted at age 65, was treated in [place], Patient never knew DX  . PONV (postoperative nausea and vomiting)     Past Surgical History  Procedure Laterality Date  . Circumcision  age 62 yrs    Multiple transfusions  . Multiple tooth extractions  Age 49    hospitalized due to bleeding disorder--transfusions     Current Outpatient Prescriptions  Medication Sig Dispense Refill  . ibuprofen (ADVIL,MOTRIN) 200 MG tablet Take 200 mg by mouth every 6 (six) hours as needed for mild pain.    . Multiple Vitamins-Minerals (MULTIVITAMIN WITH MINERALS) tablet Take 1 tablet by mouth daily.     No current facility-administered medications for this visit.    Allergies:   Shellfish allergy    Social History:  The patient  reports that he has never smoked. He has never used smokeless tobacco. He reports that he drinks alcohol. He reports that he does not use illicit drugs.   Family History:  The patient's family history includes CAD in his father; COPD in his father; Colon cancer in his mother; Heart disease in his mother.    ROS:  Please see the history of present illness.   Otherwise, review of systems are positive for decreased erectile function and decreased  urinary flow since stopping tamsulosin..   All other systems are reviewed and negative.    PHYSICAL EXAM: VS:  BP 116/76 mmHg  Pulse 55  Ht 5' 9.5" (1.765 m)  Wt 77.111 kg (170 lb)  BMI 24.75 kg/m2 , BMI Body mass index is 24.75 kg/(m^2). GEN: Well nourished, well developed, in no acute distress HEENT: normal Neck: no JVD, carotid bruits, or masses Cardiac: RRR.  There is no murmur, rub, or gallop. There is no edema. Respiratory:  clear to auscultation bilaterally, normal work of breathing. GI: soft, nontender, nondistended, + BS MS: no deformity or atrophy Skin: warm and dry, no rash Neuro:  Strength and sensation are intact Psych: euthymic mood, full affect   EKG:  EKG is ordered today. The ekg reveals sinus bradycardia, prominent voltage, otherwise unremarkable.   Recent Labs: 08/27/2014: HGB 14.0; Platelets 256    Lipid Panel No results found for: CHOL, TRIG, HDL, CHOLHDL, VLDL, LDLCALC, LDLDIRECT    Wt Readings from Last 3 Encounters:  04/10/15 77.111 kg (170 lb)  02/11/15 81.194 kg (179 lb)  08/27/14 81.194 kg (179 lb)      Other studies Reviewed: Additional studies/ records that were reviewed today include:  none. The findings include  none.    ASSESSMENT AND PLAN:  1. Essential hypertension Excellent control on low dose amlodipine  Current medicines are reviewed at length with the patient today.  The patient has the following concerns regarding medicines:  Decision about whether to continue antihypertensive therapy..  The following changes/actions have been instituted:    Discontinue amlodipine  Monitor blood pressure weekly for 6 weeks call with results  Labs/ tests ordered today include:   Orders Placed This Encounter  Procedures  . EKG 12-Lead     Disposition:   FU with HS in 1 year  Signed, Lesleigh Noe, MD  04/10/2015 5:29 PM    [facility] [address] [place], [place], [place]  [address] Phone: [phone];  Fax: [phone]

## 2016-01-25 DIAGNOSIS — R972 Elevated prostate specific antigen [PSA]: Secondary | ICD-10-CM | POA: Diagnosis not present

## 2016-02-24 DIAGNOSIS — N401 Enlarged prostate with lower urinary tract symptoms: Secondary | ICD-10-CM | POA: Diagnosis not present

## 2016-02-24 DIAGNOSIS — R972 Elevated prostate specific antigen [PSA]: Secondary | ICD-10-CM | POA: Diagnosis not present

## 2016-02-24 DIAGNOSIS — R3912 Poor urinary stream: Secondary | ICD-10-CM | POA: Diagnosis not present

## 2016-06-27 ENCOUNTER — Encounter: Payer: Self-pay | Admitting: *Deleted

## 2016-07-01 ENCOUNTER — Ambulatory Visit: Payer: Commercial Managed Care - HMO | Admitting: Interventional Cardiology

## 2016-07-07 ENCOUNTER — Encounter: Payer: Self-pay | Admitting: Interventional Cardiology

## 2016-12-26 DIAGNOSIS — R5383 Other fatigue: Secondary | ICD-10-CM | POA: Diagnosis not present

## 2017-01-03 DIAGNOSIS — T733XXA Exhaustion due to excessive exertion, initial encounter: Secondary | ICD-10-CM | POA: Diagnosis not present

## 2017-01-29 DIAGNOSIS — E785 Hyperlipidemia, unspecified: Secondary | ICD-10-CM | POA: Insufficient documentation

## 2017-01-29 NOTE — Progress Notes (Signed)
Cardiology Office Note    Date:  01/31/2017   ID:  Janice Seales, DOB 07-24-1951, MRN [id number]  PCP:  Lewis Moccasin, MD  Cardiologist: Lesleigh Noe, MD   Chief Complaint  Patient presents with  . Hypertension    History of Present Illness:  Jonathan Ramirez is a 66 y.o. male with hypertension and hyperlipidemia for f/u and CV risk modification, Which include elevated blood pressures, hyperlipidemia, and family history.  No cardiopulmonary complaints. Did have a several week history of lassitude and decreased energy earlier this spring. He is now back to biking limited only by the recent development of lumbar disc disease. No other specific complaints.    Past Medical History:  Diagnosis Date  . Asthma   . Bleeding disorder (HCC)    noted at age 81, was treated in [place], Patient never knew DX  . BPH (benign prostatic hyperplasia)   . Gynecomastia    eval by Dr. Abbey Chatters  . History of echocardiogram 3/13   no significant abn  . History of stress test 4/13   GXT- negative for ischemia  . Hyperlipidemia   . Hypertension   . Kidney calculi   . Nephrolithiasis   . PONV (postoperative nausea and vomiting)     Past Surgical History:  Procedure Laterality Date  . CIRCUMCISION  age 15 yrs   Multiple transfusions  . MULTIPLE TOOTH EXTRACTIONS  Age 5   hospitalized due to bleeding disorder--transfusions    Current Medications: Outpatient Medications Prior to Visit  Medication Sig Dispense Refill  . Multiple Vitamins-Minerals (MULTIVITAMIN WITH MINERALS) tablet Take 1 tablet by mouth daily.    Marland Kitchen ibuprofen (ADVIL,MOTRIN) 200 MG tablet Take 200 mg by mouth every 6 (six) hours as needed for mild pain.     No facility-administered medications prior to visit.      Allergies:   Shellfish allergy   Social History   Social History  . Marital status: Single    Spouse name: N/A  . Number of children: N/A  . Years of education: N/A   Social History Main  Topics  . Smoking status: Never Smoker  . Smokeless tobacco: Never Used     Comment: Never Used Tobacco  . Alcohol use 0.0 oz/week     Comment: occasionally  . Drug use: No  . Sexual activity: Not Asked   Other Topics Concern  . None   Social History Narrative  . None     Family History:  The patient's family history includes CAD in his father; COPD in his father; Colon cancer in his mother; Heart disease in his mother.   ROS:   Please see the history of present illness.    Significant back and leg discomfort, aggravated by physical activity including bicycling. On nonsteroidal anti-inflammatory therapy.  All other systems reviewed and are negative.   PHYSICAL EXAM:   VS:  BP 120/78 (BP Location: Left Arm)   Pulse 69   Ht 5' 9.5" (1.765 m)   Wt 166 lb 6.4 oz (75.5 kg)   BMI 24.22 kg/m    GEN: Well nourished, well developed, in no acute distress  HEENT: normal  Neck: no JVD, carotid bruits, or masses Cardiac: RRR; no murmurs, rubs, or gallops,no edema  Respiratory:  clear to auscultation bilaterally, normal work of breathing GI: soft, nontender, nondistended, + BS MS: no deformity or atrophy  Skin: warm and dry, no rash Neuro:  Alert and Oriented x 3, Strength and sensation are  intact Psych: euthymic mood, full affect  Wt Readings from Last 3 Encounters:  01/31/17 166 lb 6.4 oz (75.5 kg)  04/10/15 170 lb (77.1 kg)  02/11/15 179 lb (81.2 kg)      Studies/Labs Reviewed:   EKG:  EKG  Normal sinus rhythm, biatrial abnormality, prominent voltage, and secondary T-wave changes could potentially compatible with LVH. T-wave abnormality is new compared to prior.  Recent Labs: No results found for requested labs within last 8760 hours.   Lipid Panel No results found for: CHOL, TRIG, HDL, CHOLHDL, VLDL, LDLCALC, LDLDIRECT  Additional studies/ records that were reviewed today include:  No new or recent imaging data    ASSESSMENT:    1. Essential hypertension   2.  Dyslipidemia, goal LDL below 70   3. EKG abnormalities      PLAN:  In order of problems listed above:  1. Blood pressure is normal though EKG is evolving to reveal LVH with strain. Excess treadmill test will be done to assess blood pressure control. The test is not being done to assess for evidence of ischemia. He may have ST segment changes but if in the absence of symptoms, no ischemic evaluation will be pursued. His baseline abnormal EKG Will increase the likelihood of an abnormal EKG response. 2. Most recent LDL was 104 in 2014. 3. LVH with strain.    Medication Adjustments/Labs and Tests Ordered: Current medicines are reviewed at length with the patient today.  Concerns regarding medicines are outlined above.  Medication changes, Labs and Tests ordered today are listed in the Patient Instructions below. Patient Instructions  Medication Instructions:  None  Labwork: None  Testing/Procedures: Your physician has requested that you have an exercise tolerance test. For further information please visit [email or web address]. Please also follow instruction sheet, as given.   Follow-Up: Your physician wants you to follow-up in: 1 year with Dr. Katrinka BlazingSmith. You will receive a reminder letter in the mail two months in advance. If you don't receive a letter, please call our office to schedule the follow-up appointment.   Any Other Special Instructions Will Be Listed Below (If Applicable).     If you need a refill on your cardiac medications before your next appointment, please call your pharmacy.      Signed, Lesleigh NoeHenry W Smith III, MD  01/31/2017 10:12 AM    [facility] [address], [place], [place]  [address] Phone: [phone]; Fax: [phone]

## 2017-01-31 ENCOUNTER — Encounter: Payer: Self-pay | Admitting: Interventional Cardiology

## 2017-01-31 ENCOUNTER — Ambulatory Visit (INDEPENDENT_AMBULATORY_CARE_PROVIDER_SITE_OTHER): Payer: Medicare Other | Admitting: Interventional Cardiology

## 2017-01-31 VITALS — BP 120/78 | HR 69 | Ht 69.5 in | Wt 166.4 lb

## 2017-01-31 DIAGNOSIS — R9431 Abnormal electrocardiogram [ECG] [EKG]: Secondary | ICD-10-CM | POA: Diagnosis not present

## 2017-01-31 DIAGNOSIS — E785 Hyperlipidemia, unspecified: Secondary | ICD-10-CM | POA: Diagnosis not present

## 2017-01-31 DIAGNOSIS — I10 Essential (primary) hypertension: Secondary | ICD-10-CM

## 2017-01-31 NOTE — Patient Instructions (Signed)
Medication Instructions:  None  Labwork: None  Testing/Procedures: Your physician has requested that you have an exercise tolerance test. For further information please visit [email or web address]. Please also follow instruction sheet, as given.   Follow-Up: Your physician wants you to follow-up in: 1 year with Dr. Smith. You will receive a reminder letter in the mail two months in advance. If you don't receive a letter, please call our office to schedule the follow-up appointment.   Any Other Special Instructions Will Be Listed Below (If Applicable).     If you need a refill on your cardiac medications before your next appointment, please call your pharmacy.   

## 2017-02-21 ENCOUNTER — Ambulatory Visit (INDEPENDENT_AMBULATORY_CARE_PROVIDER_SITE_OTHER): Payer: Medicare Other

## 2017-02-21 DIAGNOSIS — R9431 Abnormal electrocardiogram [ECG] [EKG]: Secondary | ICD-10-CM

## 2017-02-21 LAB — EXERCISE TOLERANCE TEST
CHL CUP MPHR: 152 {beats}/min
CSEPHR: 93 %
Estimated workload: 8.6 METS
Exercise duration (min): 7 min
Exercise duration (sec): 0 s
Peak HR: 142 {beats}/min
RPE: 13
Rest HR: 76 {beats}/min

## 2017-02-23 ENCOUNTER — Telehealth: Payer: Self-pay

## 2017-02-23 MED ORDER — AMLODIPINE BESYLATE 5 MG PO TABS: 5.0000 mg | ORAL_TABLET | Freq: Every day | ORAL | 11 refills | Status: DC

## 2017-02-23 NOTE — Telephone Encounter (Signed)
-----   Message from Lyn RecordsHenry W Smith, MD sent at 02/23/2017  9:04 AM EDT ----- Let the patient know the BP is high. Start amlodipine 5 mg daily.F/u in BP Clinic. A copy will be sent to Lewis Moccasinewey, Elizabeth R, MD

## 2017-02-23 NOTE — Telephone Encounter (Signed)
Pt aware of Gxt results and Dr.Smith's recommendation. Pt agreeable with plan. Rx for Amlodipine 5 mg daily sent to patients pharmacy. Message fwd to pcc to call pt with a 1-2 week f/u with the bp clinic. Gxt results fwd to Dr.Dewey.

## 2017-03-08 NOTE — Progress Notes (Signed)
Patient ID: Jonathan Ramirez                 DOB: [birth year]                      MRN: [id number]     HPI: Jonathan Ramirez is a 66 y.o. male patient of Dr. Katrinka Blazing who presents today for hypertension evaluation. PMH includes HTN, HLD, asthma, BPH, gynecomastia,  and family history of cardiac disease. BP demonstrated a hypertensive response to exercise during stress test on 02/21/17. Pt was started on amlodipine 5 mg daily.   Pt presents today for f/u. He reports that he is tolerating the amlodipine well, has not felt anything out of the ordinary. Denies dizziness, lightheadedness, headaches, or swelling. He checks his BP at the [facility], and the reading this morning was 98/68. BP in clinic is 118/58, and HR is 65. He never has dizziness upon standing or changes in positions.   Current HTN meds:  Amlodipine 5mg  daily (mornings)  Previously tried: N/A  BP goal: <130/80  Family History: CAD in his father; COPD in his father; Colon cancer in his mother; Heart disease in his mother.  Social History: Pt has never smoked. Denies alcohol or other drug use.   Diet: Eats a lot of junk food and sandwiches. Eats out more than he cooks at home. Tries not to add additional salt to food. Drinks a lot of sweet tea and about 4 cups of coffee a week.   Exercise: He exercises at least 4 days a week. He has spin class twice a week and tries to ride his bike three times a week, tallies up a total of a couple hundred miles.   [facility]  Wt Readings from Last 3 Encounters:  01/31/17 166 lb 6.4 oz (75.5 kg)  04/10/15 170 lb (77.1 kg)  02/11/15 179 lb (81.2 kg)   BP Readings from Last 3 Encounters:  01/31/17 120/78  04/10/15 116/76  08/27/14 126/69   Pulse Readings from Last 3 Encounters:  01/31/17 69  04/10/15 (!) 55  08/27/14 64    Renal function: CrCl cannot be calculated (Patient's most recent lab result is older than the maximum 21 days allowed.).  Past  Medical History:  Diagnosis Date  . Asthma   . Bleeding disorder (HCC)    noted at age 49, was treated in [place], Patient never knew DX  . BPH (benign prostatic hyperplasia)   . Gynecomastia    eval by Dr. Abbey Chatters  . History of echocardiogram 3/13   no significant abn  . History of stress test 4/13   GXT- negative for ischemia  . Hyperlipidemia   . Hypertension   . Kidney calculi   . Nephrolithiasis   . PONV (postoperative nausea and vomiting)     Current Outpatient Prescriptions on File Prior to Visit  Medication Sig Dispense Refill  . acetaminophen (TYLENOL ARTHRITIS PAIN) 650 MG CR tablet Take 650 mg by mouth 2 (two) times daily.    Marland Kitchen amLODipine (NORVASC) 5 MG tablet Take 1 tablet (5 mg total) by mouth daily. 30 tablet 11  . ibuprofen (ADVIL,MOTRIN) 600 MG tablet Take 600 mg by mouth 2 (two) times daily.    . Multiple Vitamins-Minerals (MULTIVITAMIN WITH MINERALS) tablet Take 1 tablet by mouth daily.     No current facility-administered medications on file prior to visit.     Allergies  Allergen Reactions  .  Shellfish Allergy Anaphylaxis    There were no vitals taken for this visit.   Assessment/Plan: Hypertension: Pt's BP is at his goal of <130/80 mmHg. Continue amlodipine 5 mg daily. Encourage pt to keep checking blood pressure at home and try to eat a healthier, low sodium diet and increase water intake. F/u with Dr. Katrinka BlazingSmith as scheduled and HTN clinic as needed.   -[name], PharmD Student   Thank you, Freddie ApleyKelley M. Cleatis PolkaAuten, PharmD  [facility]  03/08/2017 10:15 PM

## 2017-03-09 ENCOUNTER — Ambulatory Visit (INDEPENDENT_AMBULATORY_CARE_PROVIDER_SITE_OTHER): Payer: Medicare Other | Admitting: Pharmacist

## 2017-03-09 ENCOUNTER — Encounter: Payer: Self-pay | Admitting: Pharmacist

## 2017-03-09 VITALS — BP 118/58 | HR 65

## 2017-03-09 DIAGNOSIS — I10 Essential (primary) hypertension: Secondary | ICD-10-CM | POA: Diagnosis not present

## 2017-03-09 NOTE — Patient Instructions (Addendum)
It was good seeing you today!  Your blood pressure is at goal today. Continue taking the amlodipine 5 mg as you have before.  Keep checking your blood pressure and try to keep a written log of the readings at home.   Try to eat a healthy, low sodium diet and increase water intake. Continue exercising regularly.  If you notice your blood pressure increasing or decreasing persistently or if you experience dizziness, lightheadedness, weakness, headaches, or swelling, please the contact at [phone].  Follow up in the clinic as needed.

## 2017-07-24 DIAGNOSIS — R972 Elevated prostate specific antigen [PSA]: Secondary | ICD-10-CM | POA: Diagnosis not present

## 2017-07-28 DIAGNOSIS — R972 Elevated prostate specific antigen [PSA]: Secondary | ICD-10-CM | POA: Diagnosis not present

## 2017-07-28 DIAGNOSIS — R350 Frequency of micturition: Secondary | ICD-10-CM | POA: Diagnosis not present

## 2017-07-28 DIAGNOSIS — N401 Enlarged prostate with lower urinary tract symptoms: Secondary | ICD-10-CM | POA: Diagnosis not present

## 2017-12-15 ENCOUNTER — Other Ambulatory Visit: Payer: Self-pay | Admitting: Interventional Cardiology

## 2018-01-30 ENCOUNTER — Other Ambulatory Visit: Payer: Self-pay | Admitting: Interventional Cardiology

## 2018-01-30 MED ORDER — AMLODIPINE BESYLATE 5 MG PO TABS: 5.0000 mg | ORAL_TABLET | Freq: Every day | ORAL | 0 refills | Status: DC

## 2018-01-30 NOTE — Telephone Encounter (Signed)
Pt's medication was sent to pt's pharmacy as requested. Confirmation received.  °

## 2018-01-30 NOTE — Telephone Encounter (Signed)
Pt calling.    *STAT* If patient is at the pharmacy, call can be transferred to refill team.   1. Which medications need to be refilled? (please list name of each medication and dose if known) pt not sure but its a blood pressure medication.  2. Which pharmacy/location (including street and city if local pharmacy) is medication to be sent to?[facility]  3. Do they need a 30 day or 90 day supply? 90

## 2018-04-24 ENCOUNTER — Ambulatory Visit: Payer: Medicare Other | Admitting: Interventional Cardiology

## 2018-04-28 ENCOUNTER — Other Ambulatory Visit: Payer: Self-pay | Admitting: Interventional Cardiology

## 2018-04-29 NOTE — Progress Notes (Signed)
Cardiology Office Note:    Date:  04/30/2018   ID:  Jonathan Ramirez, DOB [birth year], MRN [id number]  PCP:  Lewis Moccasin, MD  Cardiologist:  Lesleigh Noe, MD   Referring MD: Lewis Moccasin, MD   Chief Complaint  Patient presents with  . Hypertension    History of Present Illness:    Jonathan Ramirez is a 67 y.o. male with a hx of hypertension and hyperlipidemia for f/u and CV risk modification.  .  He has been biking all summer.  No chest discomfort, orthopnea, PND, or decrease in exertional tolerance.  During the trek across [place], the temperatures were greater than 100 F.  He has not had palpitations, leg edema, or other complaints.  No episodes of syncope.   Past Medical History:  Diagnosis Date  . Asthma   . Bleeding disorder (HCC)    noted at age 31, was treated in [place], Patient never knew DX  . BPH (benign prostatic hyperplasia)   . Gynecomastia    eval by Dr. Abbey Chatters  . History of echocardiogram 3/13   no significant abn  . History of stress test 4/13   GXT- negative for ischemia  . Hyperlipidemia   . Hypertension   . Kidney calculi   . Nephrolithiasis   . PONV (postoperative nausea and vomiting)     Past Surgical History:  Procedure Laterality Date  . CIRCUMCISION  age 63 yrs   Multiple transfusions  . MULTIPLE TOOTH EXTRACTIONS  Age 22   hospitalized due to bleeding disorder--transfusions    Current Medications: Current Meds  Medication Sig  . acetaminophen (TYLENOL ARTHRITIS PAIN) 650 MG CR tablet Take 650 mg by mouth 2 (two) times daily.  Marland Kitchen amLODipine (NORVASC) 5 MG tablet Take 1 tablet (5 mg total) by mouth daily. Please keep upcoming appt in October before anymore refills. Thank you  . ibuprofen (ADVIL,MOTRIN) 600 MG tablet Take 600 mg by mouth daily.   . Multiple Vitamins-Minerals (MULTIVITAMIN WITH MINERALS) tablet Take 1 tablet by mouth daily.     Allergies:   Shellfish allergy   Social History   Socioeconomic History    . Marital status: Single    Spouse name: Not on file  . Number of children: Not on file  . Years of education: Not on file  . Highest education level: Not on file  Occupational History  . Not on file  Social Needs  . Financial resource strain: Not on file  . Food insecurity:    Worry: Not on file    Inability: Not on file  . Transportation needs:    Medical: Not on file    Non-medical: Not on file  Tobacco Use  . Smoking status: Never Smoker  . Smokeless tobacco: Never Used  . Tobacco comment: Never Used Tobacco  Substance and Sexual Activity  . Alcohol use: Yes    Alcohol/week: 0.0 standard drinks    Comment: occasionally  . Drug use: No  . Sexual activity: Not on file  Lifestyle  . Physical activity:    Days per week: Not on file    Minutes per session: Not on file  . Stress: Not on file  Relationships  . Social connections:    Talks on phone: Not on file    Gets together: Not on file    Attends religious service: Not on file    Active member of club or organization: Not on file    Attends meetings of clubs or  organizations: Not on file    Relationship status: Not on file  Other Topics Concern  . Not on file  Social History Narrative  . Not on file     Family History: The patient's family history includes CAD in his father; COPD in his father; Colon cancer in his mother; Heart disease in his mother.  ROS:   Please see the history of present illness.    Back pain, muscle pain, easy bruising.  All other systems reviewed and are negative.  EKGs/Labs/Other Studies Reviewed:    The following studies were reviewed today: No  EKG:  EKG is  ordered today.  The ekg ordered today demonstrates sinus bradycardia, 53 bpm.  Prominent voltage.  Recent Labs: No results found for requested labs within last 8760 hours.  Recent Lipid Panel No results found for: CHOL, TRIG, HDL, CHOLHDL, VLDL, LDLCALC, LDLDIRECT  Physical Exam:    VS:  BP 114/72   Pulse (!) 53   Ht  5' 9.5" (1.765 m)   Wt 177 lb 12.8 oz (80.6 kg)   BMI 25.88 kg/m     Wt Readings from Last 3 Encounters:  04/30/18 177 lb 12.8 oz (80.6 kg)  01/31/17 166 lb 6.4 oz (75.5 kg)  04/10/15 170 lb (77.1 kg)     GEN:  Well nourished, well developed in no acute distress HEENT: Normal NECK: No JVD. LYMPHATICS: No lymphadenopathy CARDIAC: RRR, no murmur, no gallop, no edema. VASCULAR: 2+ bilateral radial pulses.  No bruits. RESPIRATORY:  Clear to auscultation without rales, wheezing or rhonchi  ABDOMEN: Soft, non-tender, non-distended, No pulsatile mass, MUSCULOSKELETAL: No deformity  SKIN: Warm and dry NEUROLOGIC:  Alert and oriented x 3 PSYCHIATRIC:  Normal affect   ASSESSMENT:    1. Essential hypertension   2. Dyslipidemia, goal LDL below 70    PLAN:    In order of problems listed above:  1. Overall, excellent result.  Has not yet had amlodipine this morning.  Discussed the metric of 130/80 mmHg.  Low-salt diet. 2. Needs follow-up.  Upcoming complete exam with his primary physician.  LDL target less than 100.  Clinical follow-up in 1 year or earlier   Medication Adjustments/Labs and Tests Ordered: Current medicines are reviewed at length with the patient today.  Concerns regarding medicines are outlined above.  Orders Placed This Encounter  Procedures  . EKG 12-Lead   No orders of the defined types were placed in this encounter.   There are no Patient Instructions on file for this visit.   Signed, Lesleigh Noe, MD  04/30/2018 9:47 AM    [facility]

## 2018-04-30 ENCOUNTER — Ambulatory Visit (INDEPENDENT_AMBULATORY_CARE_PROVIDER_SITE_OTHER): Payer: Medicare Other | Admitting: Interventional Cardiology

## 2018-04-30 ENCOUNTER — Encounter (INDEPENDENT_AMBULATORY_CARE_PROVIDER_SITE_OTHER): Payer: Self-pay

## 2018-04-30 ENCOUNTER — Encounter: Payer: Self-pay | Admitting: Interventional Cardiology

## 2018-04-30 VITALS — BP 114/72 | HR 53 | Ht 69.5 in | Wt 177.8 lb

## 2018-04-30 DIAGNOSIS — E785 Hyperlipidemia, unspecified: Secondary | ICD-10-CM | POA: Diagnosis not present

## 2018-04-30 DIAGNOSIS — I10 Essential (primary) hypertension: Secondary | ICD-10-CM | POA: Diagnosis not present

## 2018-04-30 MED ORDER — AMLODIPINE BESYLATE 5 MG PO TABS: 5.0000 mg | ORAL_TABLET | Freq: Every day | ORAL | 3 refills | Status: DC

## 2018-04-30 NOTE — Patient Instructions (Signed)
Medication Instructions:  Your provider recommends that you continue on your current medications as directed. Please refer to the Current Medication list given to you today.    Labwork: None  Testing/Procedures: None  Follow-Up: Your provider wants you to follow-up in: 1 year with Dr. Smith. You will receive a reminder letter in the mail two months in advance. If you don't receive a letter, please call our office to schedule the follow-up appointment.    Any Other Special Instructions Will Be Listed Below (If Applicable).     If you need a refill on your cardiac medications before your next appointment, please call your pharmacy.   

## 2018-07-02 ENCOUNTER — Telehealth: Payer: Self-pay | Admitting: Interventional Cardiology

## 2018-07-02 DIAGNOSIS — I10 Essential (primary) hypertension: Secondary | ICD-10-CM

## 2018-07-02 MED ORDER — HYDROCHLOROTHIAZIDE 12.5 MG PO CAPS: 12.5000 mg | ORAL_CAPSULE | Freq: Every day | ORAL | 3 refills | Status: DC

## 2018-07-02 NOTE — Telephone Encounter (Signed)
Spoke with pt and went over recommendations per Dr. Katrinka BlazingSmith.  Pt verbalized understanding and was in agreement with this plan.  Pt will come for labs on 12/18.

## 2018-07-02 NOTE — Telephone Encounter (Signed)
  Pt c/o swelling: STAT is pt has developed SOB within 24 hours  1) How much weight have you gained and in what time span? no  2) If swelling, where is the swelling located? Legs, ankles and feet  3) Are you currently taking a fluid pill? no  4) Are you currently SOB? no  5) Do you have a log of your daily weights (if so, list)? no  6) Have you gained 3 pounds in a day or 5 pounds in a week?no  7) Have you traveled recently? No  Patient is having some swelling in legs, feet and ankles. Puts legs up at night and the swelling goes away some. Patient wonders if they need to take something for the swelling.

## 2018-07-02 NOTE — Telephone Encounter (Signed)
Stop amlodipine. Start HCTZ 12.5 mg daily. Basic metabolic panel in 7 to 10 days.

## 2018-07-02 NOTE — Telephone Encounter (Signed)
Pt calling in, with non-urgent complaints, to endorse to Dr Katrinka BlazingSmith and Nurse. Pt states that for over a week or so, he noticed lower extremity edema, bilaterally, in the evenings when he begins to settle in for the night.  Pt states the swelling is to his feet, ankles and lower calf area.  Pt states he is NOT SOB, has no CP, and no DOE.  Pt states he is not weighing himself daily.  Pt reports he is a very active individual, and is use to riding several miles on his bike. Pt states he has not done this activity for a couple weeks now. Pt states he has not changed up his routine of meds he takes. Pt inquring what he should do about this.  Informed the pt that Dr Katrinka BlazingSmith and Victorino DikeJennifer LPN are both out of the office today, but I will route this message to them for further review and recommendation, upon return to the office. Advised the pt that in the meantime, he should avoid salt, hydrate with water, wear compression stockings during the day (pt stated he had a pair of athletic compression stockings), do a dry weight on himself every morning and record this for a couple of days and send accordingly for Dr Katrinka BlazingSmith and Victorino DikeJennifer for further review and recommendation. Advised the pt to continue elevating his extremities at rest.  Pt verbalized understanding and agrees with this plan.

## 2018-07-11 ENCOUNTER — Other Ambulatory Visit: Payer: Medicare Other

## 2018-07-12 ENCOUNTER — Other Ambulatory Visit: Payer: Medicare Other

## 2018-07-12 DIAGNOSIS — I10 Essential (primary) hypertension: Secondary | ICD-10-CM

## 2018-07-13 LAB — BASIC METABOLIC PANEL
BUN/Creatinine Ratio: 15 (ref 10–24)
BUN: 16 mg/dL (ref 8–27)
CALCIUM: 9.5 mg/dL (ref 8.6–10.2)
CO2: 25 mmol/L (ref 20–29)
CREATININE: 1.08 mg/dL (ref 0.76–1.27)
Chloride: 99 mmol/L (ref 96–106)
GFR calc Af Amer: 81 mL/min/{1.73_m2} (ref >59)
GFR, EST NON AFRICAN AMERICAN: 70 mL/min/{1.73_m2} (ref >59)
Glucose: 102 mg/dL — ABNORMAL HIGH (ref 65–99)
Potassium: 4.2 mmol/L (ref 3.5–5.2)
Sodium: 139 mmol/L (ref 134–144)

## 2018-09-13 DIAGNOSIS — R972 Elevated prostate specific antigen [PSA]: Secondary | ICD-10-CM | POA: Diagnosis not present

## 2018-10-01 DIAGNOSIS — R351 Nocturia: Secondary | ICD-10-CM | POA: Diagnosis not present

## 2018-10-01 DIAGNOSIS — N401 Enlarged prostate with lower urinary tract symptoms: Secondary | ICD-10-CM | POA: Diagnosis not present

## 2018-10-01 DIAGNOSIS — R972 Elevated prostate specific antigen [PSA]: Secondary | ICD-10-CM | POA: Diagnosis not present

## 2018-12-25 DIAGNOSIS — N500 Atrophy of testis: Secondary | ICD-10-CM | POA: Diagnosis not present

## 2019-01-07 DIAGNOSIS — R972 Elevated prostate specific antigen [PSA]: Secondary | ICD-10-CM | POA: Diagnosis not present

## 2019-01-07 DIAGNOSIS — N451 Epididymitis: Secondary | ICD-10-CM | POA: Diagnosis not present

## 2019-01-07 DIAGNOSIS — N529 Male erectile dysfunction, unspecified: Secondary | ICD-10-CM | POA: Diagnosis not present

## 2019-01-07 DIAGNOSIS — N390 Urinary tract infection, site not specified: Secondary | ICD-10-CM | POA: Diagnosis not present

## 2019-01-18 DIAGNOSIS — N529 Male erectile dysfunction, unspecified: Secondary | ICD-10-CM | POA: Diagnosis not present

## 2019-01-18 DIAGNOSIS — N451 Epididymitis: Secondary | ICD-10-CM | POA: Diagnosis not present

## 2019-01-18 DIAGNOSIS — R972 Elevated prostate specific antigen [PSA]: Secondary | ICD-10-CM | POA: Diagnosis not present

## 2019-01-30 DIAGNOSIS — N451 Epididymitis: Secondary | ICD-10-CM | POA: Diagnosis not present

## 2019-01-30 DIAGNOSIS — R972 Elevated prostate specific antigen [PSA]: Secondary | ICD-10-CM | POA: Diagnosis not present

## 2019-03-11 DIAGNOSIS — Z23 Encounter for immunization: Secondary | ICD-10-CM | POA: Diagnosis not present

## 2019-04-28 DIAGNOSIS — Z20828 Contact with and (suspected) exposure to other viral communicable diseases: Secondary | ICD-10-CM | POA: Diagnosis not present

## 2019-05-29 DIAGNOSIS — Z20828 Contact with and (suspected) exposure to other viral communicable diseases: Secondary | ICD-10-CM | POA: Diagnosis not present

## 2019-06-17 NOTE — Progress Notes (Signed)
Cardiology Office Note:    Date:  06/19/2019   ID:  Jonathan Ramirez, DOB [birth year], MRN [id number]  PCP:  Jonathan Bien, MD  Cardiologist:  Jonathan Grooms, MD   Referring MD: Jonathan Bien, MD   Chief Complaint  Patient presents with   Congestive Heart Failure   Hypertension    History of Present Illness:    Jonathan Ramirez is a 68 y.o. male with a hx of  hypertension and hyperlipidemia for f/u and CV risk modification.  Jonathan Ramirez  is doing well.  He has dyspnea with biking activity that he did not have 5 or 10 years ago.  He is still very competitive within his age group related to biking.  He is not having orthopnea, PND, or edema.  He now understands that as he ages his endurance will tend to decrease.  When he first began noticing that his distances and timing was decreasing, he was worried there was coronary disease.  He denies orthopnea, PND, lower extremity swelling, palpitations, diaphoresis, or other cardiac complaints.    Past Medical History:  Diagnosis Date   Asthma    Bleeding disorder (Jonathan Ramirez)    noted at age 54, was treated in [place], Patient never knew DX   BPH (benign prostatic hyperplasia)    Gynecomastia    eval by Jonathan Ramirez   History of echocardiogram 3/13   no significant abn   History of stress test 4/13   GXT- negative for ischemia   Hyperlipidemia    Hypertension    Kidney calculi    Nephrolithiasis    PONV (postoperative nausea and vomiting)     Past Surgical History:  Procedure Laterality Date   CIRCUMCISION  age 53 yrs   Multiple transfusions   MULTIPLE TOOTH EXTRACTIONS  Age 45   hospitalized due to bleeding disorder--transfusions    Current Medications: Current Meds  Medication Sig   hydrochlorothiazide (MICROZIDE) 12.5 MG capsule Take 1 capsule (12.5 mg total) by mouth daily.   Multiple Vitamins-Minerals (MULTIVITAMIN WITH MINERALS) tablet Take 1 tablet by mouth daily.   tamsulosin (FLOMAX) 0.4  MG CAPS capsule Take 0.4 mg by mouth daily.   UNABLE TO FIND CBD gummy as needed     Allergies:   Shellfish allergy   Social History   Socioeconomic History   Marital status: Single    Spouse name: Not on file   Number of children: Not on file   Years of education: Not on file   Highest education level: Not on file  Occupational History   Not on file  Social Needs   Financial resource strain: Not on file   Food insecurity    Worry: Not on file    Inability: Not on file   Transportation needs    Medical: Not on file    Non-medical: Not on file  Tobacco Use   Smoking status: Never Smoker   Smokeless tobacco: Never Used   Tobacco comment: Never Used Tobacco  Substance and Sexual Activity   Alcohol use: Yes    Alcohol/week: 0.0 standard drinks    Comment: occasionally   Drug use: No   Sexual activity: Not on file  Lifestyle   Physical activity    Days per week: Not on file    Minutes per session: Not on file   Stress: Not on file  Relationships   Social connections    Talks on phone: Not on file    Gets together: Not on file  Attends religious service: Not on file    Active member of club or organization: Not on file    Attends meetings of clubs or organizations: Not on file    Relationship status: Not on file  Other Topics Concern   Not on file  Social History Narrative   Not on file     Family History: The patient's family history includes CAD in his father; COPD in his father; Colon cancer in his mother; Heart disease in his mother.  ROS:   Please see the history of present illness.    He is stressed because his daughter who is a respiratory therapist developed Covid earlier this year.  She works as a traveling Statistician.  He is the resident Jonathan Ramirez who is taking care of the entire family.  He has 5 grandchildren 2 of which are hers.  He takes his 5 year old granddaughter to school, and does all of her parenting and the  daughter's absence.  All other systems reviewed and are negative.  EKGs/Labs/Other Studies Reviewed:    The following studies were reviewed today: No new cardiac data  EKG:  EKG prominent voltage, normal sinus rhythm, EKG criteria reach left ventricular hypertrophy diagnosis.  Recent Labs: 07/12/2018: BUN 16; Creatinine, Ser 1.08; Potassium 4.2; Sodium 139  Recent Lipid Panel No results found for: CHOL, TRIG, HDL, CHOLHDL, VLDL, LDLCALC, LDLDIRECT  Physical Exam:    VS:  BP 132/86    Pulse 75    Ht 5' 9.5" (1.765 m)    Wt 181 lb 9.6 oz (82.4 kg)    SpO2 96%    BMI 26.43 kg/m     Wt Readings from Last 3 Encounters:  06/19/19 181 lb 9.6 oz (82.4 kg)  04/30/18 177 lb 12.8 oz (80.6 kg)  01/31/17 166 lb 6.4 oz (75.5 kg)     GEN: Appears healthy. No acute distress HEENT: Normal NECK: No JVD. LYMPHATICS: No lymphadenopathy CARDIAC:  RRR without murmur, gallop, or edema. VASCULAR:  Normal Pulses. No bruits. RESPIRATORY:  Clear to auscultation without rales, wheezing or rhonchi  ABDOMEN: Soft, non-tender, non-distended, No pulsatile mass, MUSCULOSKELETAL: No deformity  SKIN: Warm and dry NEUROLOGIC:  Alert and oriented x 3 PSYCHIATRIC:  Normal affect   ASSESSMENT:    1. Essential hypertension   2. Dyslipidemia, goal LDL below 70   3. Educated about COVID-19 virus infection    PLAN:    In order of problems listed above:  1. Blood pressure control is adequate.  Low-salt diet and weight loss are encouraged. 2. No recent lipid panel.  We will request a fasting c-Met and lipid panel later this year or early next year. 3. 3W's is being practiced to avoid COVID-19 infection.  Low-salt diet, aerobic activity, and risk factor modification is needed.   Medication Adjustments/Labs and Tests Ordered: Current medicines are reviewed at length with the patient today.  Concerns regarding medicines are outlined above.  Orders Placed This Encounter  Procedures   EKG 12-Lead   No  orders of the defined types were placed in this encounter.   Patient Instructions  Medication Instructions:  Your physician recommends that you continue on your current medications as directed. Please refer to the Current Medication list given to you today.  *If you need a refill on your cardiac medications before your next appointment, please call your pharmacy*  Lab Work: None If you have labs (blood work) drawn today and your tests are completely normal, you will receive your results only  by:  MyChart Message (if you have MyChart) OR  A paper copy in the mail If you have any lab test that is abnormal or we need to change your treatment, we will call you to review the results.  Testing/Procedures: None  Follow-Up: At [facility], you and your health needs are our priority.  As part of our continuing mission to provide you with exceptional heart care, we have created designated Provider Care Teams.  These Care Teams include your primary Cardiologist (physician) and Advanced Practice Providers (APPs -  Physician Assistants and Nurse Practitioners) who all work together to provide you with the care you need, when you need it.  Your next appointment:   12 month(s)  The format for your next appointment:   In Person  Provider:   You may see Jonathan Grooms, MD or one of the following Advanced Practice Providers on your designated Care Team:    Truitt Merle, NP  Cecilie Kicks, NP  Kathyrn Drown, NP   Other Instructions      Signed, Jonathan Grooms, MD  06/19/2019 5:07 PM    [place]

## 2019-06-19 ENCOUNTER — Encounter: Payer: Self-pay | Admitting: Interventional Cardiology

## 2019-06-19 ENCOUNTER — Ambulatory Visit (INDEPENDENT_AMBULATORY_CARE_PROVIDER_SITE_OTHER): Payer: Medicare Other | Admitting: Interventional Cardiology

## 2019-06-19 ENCOUNTER — Other Ambulatory Visit: Payer: Self-pay

## 2019-06-19 VITALS — BP 132/86 | HR 75 | Ht 69.5 in | Wt 181.6 lb

## 2019-06-19 DIAGNOSIS — Z7189 Other specified counseling: Secondary | ICD-10-CM

## 2019-06-19 DIAGNOSIS — E785 Hyperlipidemia, unspecified: Secondary | ICD-10-CM

## 2019-06-19 DIAGNOSIS — I10 Essential (primary) hypertension: Secondary | ICD-10-CM

## 2019-06-19 NOTE — Patient Instructions (Signed)

## 2019-06-24 ENCOUNTER — Other Ambulatory Visit: Payer: Self-pay | Admitting: Interventional Cardiology

## 2019-06-27 ENCOUNTER — Other Ambulatory Visit: Payer: Self-pay | Admitting: *Deleted

## 2019-06-27 DIAGNOSIS — E785 Hyperlipidemia, unspecified: Secondary | ICD-10-CM

## 2019-06-27 DIAGNOSIS — I10 Essential (primary) hypertension: Secondary | ICD-10-CM

## 2019-07-02 ENCOUNTER — Other Ambulatory Visit: Payer: Medicare Other

## 2019-07-02 ENCOUNTER — Other Ambulatory Visit: Payer: Self-pay

## 2019-07-02 DIAGNOSIS — I10 Essential (primary) hypertension: Secondary | ICD-10-CM

## 2019-07-02 DIAGNOSIS — E785 Hyperlipidemia, unspecified: Secondary | ICD-10-CM

## 2019-07-02 LAB — LIPID PANEL
Chol/HDL Ratio: 3.5 ratio (ref 0.0–5.0)
Cholesterol, Total: 198 mg/dL (ref 100–199)
HDL: 57 mg/dL (ref >39)
LDL Chol Calc (NIH): 128 mg/dL — ABNORMAL HIGH (ref 0–99)
Triglycerides: 72 mg/dL (ref 0–149)
VLDL Cholesterol Cal: 13 mg/dL (ref 5–40)

## 2019-07-02 LAB — BASIC METABOLIC PANEL
BUN/Creatinine Ratio: 15 (ref 10–24)
BUN: 14 mg/dL (ref 8–27)
CO2: 28 mmol/L (ref 20–29)
Calcium: 9.9 mg/dL (ref 8.6–10.2)
Chloride: 100 mmol/L (ref 96–106)
Creatinine, Ser: 0.91 mg/dL (ref 0.76–1.27)
GFR calc Af Amer: 98 mL/min/{1.73_m2} (ref >59)
GFR calc non Af Amer: 85 mL/min/{1.73_m2} (ref >59)
Glucose: 98 mg/dL (ref 65–99)
Potassium: 4 mmol/L (ref 3.5–5.2)
Sodium: 142 mmol/L (ref 134–144)

## 2019-07-08 ENCOUNTER — Telehealth: Payer: Self-pay | Admitting: Interventional Cardiology

## 2019-07-08 DIAGNOSIS — E785 Hyperlipidemia, unspecified: Secondary | ICD-10-CM

## 2019-07-08 DIAGNOSIS — Z79899 Other long term (current) drug therapy: Secondary | ICD-10-CM

## 2019-07-08 MED ORDER — ROSUVASTATIN CALCIUM 10 MG PO TABS: 10.0000 mg | ORAL_TABLET | Freq: Every day | ORAL | 3 refills | Status: DC

## 2019-07-08 NOTE — Telephone Encounter (Signed)
New Message  Patient states that after his lab results. He was supposed to start a new medication. Patient is returning call in regards to results and a new medication. Please give patient a call back.

## 2019-07-08 NOTE — Telephone Encounter (Signed)
Let the patient know Rosuvastatin 10 mg daily. Liver and lipid in 6-8 weeks A copy will be sent to Jonathan Bien, MD  Pt is aware Rx sent into [facility] [address] x 3, lab ordered and scheduled for 08/28/2019.

## 2019-08-16 ENCOUNTER — Ambulatory Visit: Payer: Medicare Other | Attending: Internal Medicine

## 2019-08-16 DIAGNOSIS — Z23 Encounter for immunization: Secondary | ICD-10-CM | POA: Insufficient documentation

## 2019-08-19 NOTE — Progress Notes (Signed)
   Covid-19 Vaccination Clinic  Name:  Jonathan Ramirez    MRN: [id number] DOB: 1951-03-03  08/16/2019  Mr. [name] was observed post Covid-19 immunization for 15 minutes without incidence. He was provided with Vaccine Information Sheet and instruction to access the V-Safe system.   Mr. [name] was instructed to call 911 with any severe reactions post vaccine: Marland Kitchen Difficulty breathing  . Swelling of your face and throat  . A fast heartbeat  . A bad rash all over your body  . Dizziness and weakness    Immunizations Administered    Name Date Dose VIS Date Route   Moderna COVID-19 Vaccine 08/16/2019 10:12 AM 0.5 mL 06/25/2019 Intramuscular   Manufacturer: Moderna   Lot: [id number]   NDC: 80165-537-48

## 2019-08-28 ENCOUNTER — Other Ambulatory Visit: Payer: Medicare Other | Admitting: *Deleted

## 2019-08-28 ENCOUNTER — Other Ambulatory Visit: Payer: Self-pay

## 2019-08-28 DIAGNOSIS — E785 Hyperlipidemia, unspecified: Secondary | ICD-10-CM | POA: Diagnosis not present

## 2019-08-28 DIAGNOSIS — Z79899 Other long term (current) drug therapy: Secondary | ICD-10-CM | POA: Diagnosis not present

## 2019-08-28 LAB — HEPATIC FUNCTION PANEL
ALT: 19 IU/L (ref 0–44)
AST: 28 IU/L (ref 0–40)
Albumin: 4.3 g/dL (ref 3.8–4.8)
Alkaline Phosphatase: 83 IU/L (ref 39–117)
Bilirubin Total: 0.5 mg/dL (ref 0.0–1.2)
Bilirubin, Direct: 0.15 mg/dL (ref 0.00–0.40)
Total Protein: 6.4 g/dL (ref 6.0–8.5)

## 2019-08-28 LAB — LIPID PANEL
Chol/HDL Ratio: 2.8 ratio (ref 0.0–5.0)
Cholesterol, Total: 135 mg/dL (ref 100–199)
HDL: 49 mg/dL (ref >39)
LDL Chol Calc (NIH): 72 mg/dL (ref 0–99)
Triglycerides: 71 mg/dL (ref 0–149)
VLDL Cholesterol Cal: 14 mg/dL (ref 5–40)

## 2019-09-13 ENCOUNTER — Ambulatory Visit: Payer: Medicare Other

## 2019-09-20 ENCOUNTER — Ambulatory Visit: Payer: Medicare Other | Attending: Critical Care Medicine

## 2019-09-20 DIAGNOSIS — Z23 Encounter for immunization: Secondary | ICD-10-CM | POA: Insufficient documentation

## 2019-09-20 NOTE — Progress Notes (Signed)
   Covid-19 Vaccination Clinic  Name:  Jonathan Ramirez    MRN: [id number] DOB: [birth year]  09/20/2019  [name] was observed post Covid-19 immunization for 30 minutes based on pre-vaccination screening without incidence. He was provided with Vaccine Information Sheet and instruction to access the V-Safe system.   [name] was instructed to call 911 with any severe reactions post vaccine: Marland Kitchen Difficulty breathing  . Swelling of your face and throat  . A fast heartbeat  . A bad rash all over your body  . Dizziness and weakness    Immunizations Administered    Name Date Dose VIS Date Route   Moderna COVID-19 Vaccine 09/20/2019  9:48 AM 0.5 mL 06/25/2019 Intramuscular   Manufacturer: Moderna   Lot: [id number]   NDC: 62694-854-62

## 2019-10-11 DIAGNOSIS — R972 Elevated prostate specific antigen [PSA]: Secondary | ICD-10-CM | POA: Diagnosis not present

## 2019-10-18 DIAGNOSIS — N401 Enlarged prostate with lower urinary tract symptoms: Secondary | ICD-10-CM | POA: Diagnosis not present

## 2019-10-18 DIAGNOSIS — N5201 Erectile dysfunction due to arterial insufficiency: Secondary | ICD-10-CM | POA: Diagnosis not present

## 2019-10-18 DIAGNOSIS — R972 Elevated prostate specific antigen [PSA]: Secondary | ICD-10-CM | POA: Diagnosis not present

## 2019-10-18 DIAGNOSIS — R351 Nocturia: Secondary | ICD-10-CM | POA: Diagnosis not present

## 2019-10-29 ENCOUNTER — Other Ambulatory Visit: Payer: Self-pay

## 2019-10-29 ENCOUNTER — Emergency Department (HOSPITAL_COMMUNITY)
Admission: EM | Admit: 2019-10-29 | Discharge: 2019-10-29 | Disposition: A | Discharge status: Home / Self Care | Payer: Medicare Other | Attending: Emergency Medicine | Admitting: Emergency Medicine

## 2019-10-29 ENCOUNTER — Encounter (HOSPITAL_COMMUNITY): Payer: Self-pay

## 2019-10-29 ENCOUNTER — Emergency Department (HOSPITAL_COMMUNITY): Payer: Medicare Other

## 2019-10-29 DIAGNOSIS — Z79899 Other long term (current) drug therapy: Secondary | ICD-10-CM | POA: Diagnosis not present

## 2019-10-29 DIAGNOSIS — R1012 Left upper quadrant pain: Secondary | ICD-10-CM | POA: Diagnosis present

## 2019-10-29 DIAGNOSIS — I10 Essential (primary) hypertension: Secondary | ICD-10-CM | POA: Diagnosis not present

## 2019-10-29 DIAGNOSIS — N201 Calculus of ureter: Secondary | ICD-10-CM | POA: Diagnosis not present

## 2019-10-29 DIAGNOSIS — N23 Unspecified renal colic: Secondary | ICD-10-CM | POA: Diagnosis not present

## 2019-10-29 LAB — COMPREHENSIVE METABOLIC PANEL
ALT: 23 U/L (ref 0–44)
AST: 34 U/L (ref 15–41)
Albumin: 4.3 g/dL (ref 3.5–5.0)
Alkaline Phosphatase: 66 U/L (ref 38–126)
Anion gap: 8 (ref 5–15)
BUN: 25 mg/dL — ABNORMAL HIGH (ref 8–23)
CO2: 27 mmol/L (ref 22–32)
Calcium: 9.3 mg/dL (ref 8.9–10.3)
Chloride: 104 mmol/L (ref 98–111)
Creatinine, Ser: 1.72 mg/dL — ABNORMAL HIGH (ref 0.61–1.24)
GFR calc Af Amer: 46 mL/min — ABNORMAL LOW (ref >60)
GFR calc non Af Amer: 39 mL/min — ABNORMAL LOW (ref >60)
Glucose, Bld: 115 mg/dL — ABNORMAL HIGH (ref 70–99)
Potassium: 3.4 mmol/L — ABNORMAL LOW (ref 3.5–5.1)
Sodium: 139 mmol/L (ref 135–145)
Total Bilirubin: 0.8 mg/dL (ref 0.3–1.2)
Total Protein: 6.6 g/dL (ref 6.5–8.1)

## 2019-10-29 LAB — URINALYSIS, ROUTINE W REFLEX MICROSCOPIC
Bilirubin Urine: NEGATIVE
Glucose, UA: NEGATIVE mg/dL
Hgb urine dipstick: NEGATIVE
Ketones, ur: 5 mg/dL — AB
Leukocytes,Ua: NEGATIVE
Nitrite: NEGATIVE
Protein, ur: 30 mg/dL — AB
Specific Gravity, Urine: 1.023 (ref 1.005–1.030)
pH: 5 (ref 5.0–8.0)

## 2019-10-29 LAB — CBC WITH DIFFERENTIAL/PLATELET
Abs Immature Granulocytes: 0.03 10*3/uL (ref 0.00–0.07)
Basophils Absolute: 0 10*3/uL (ref 0.0–0.1)
Basophils Relative: 0 %
Eosinophils Absolute: 0.1 10*3/uL (ref 0.0–0.5)
Eosinophils Relative: 2 %
HCT: 40.2 % (ref 39.0–52.0)
Hemoglobin: 13.4 g/dL (ref 13.0–17.0)
Immature Granulocytes: 0 %
Lymphocytes Relative: 12 %
Lymphs Abs: 1 10*3/uL (ref 0.7–4.0)
MCH: 29.4 pg (ref 26.0–34.0)
MCHC: 33.3 g/dL (ref 30.0–36.0)
MCV: 88.2 fL (ref 80.0–100.0)
Monocytes Absolute: 0.8 10*3/uL (ref 0.1–1.0)
Monocytes Relative: 10 %
Neutro Abs: 6 10*3/uL (ref 1.7–7.7)
Neutrophils Relative %: 76 %
Platelets: 222 10*3/uL (ref 150–400)
RBC: 4.56 MIL/uL (ref 4.22–5.81)
RDW: 12.9 % (ref 11.5–15.5)
WBC: 8 10*3/uL (ref 4.0–10.5)
nRBC: 0 % (ref 0.0–0.2)

## 2019-10-29 LAB — LIPASE, BLOOD: Lipase: 19 U/L (ref 11–51)

## 2019-10-29 MED ORDER — ALUM & MAG HYDROXIDE-SIMETH 200-200-20 MG/5ML PO SUSP
30.0000 mL | Freq: Once | ORAL | Status: AC
  Administered 2019-10-29: 30 mL via ORAL
  Filled 2019-10-29: qty 30

## 2019-10-29 MED ORDER — ONDANSETRON 8 MG PO TBDP: 8.0000 mg | ORAL_TABLET | Freq: Three times a day (TID) | ORAL | 0 refills | Status: DC | PRN

## 2019-10-29 MED ORDER — IBUPROFEN 600 MG PO TABS: 600.0000 mg | ORAL_TABLET | Freq: Four times a day (QID) | ORAL | 0 refills | Status: DC | PRN

## 2019-10-29 MED ORDER — FENTANYL CITRATE (PF) 100 MCG/2ML IJ SOLN
50.0000 ug | Freq: Once | INTRAMUSCULAR | Status: AC
  Administered 2019-10-29: 50 ug via INTRAVENOUS
  Filled 2019-10-29: qty 2

## 2019-10-29 MED ORDER — SODIUM CHLORIDE 0.9 % IV BOLUS
500.0000 mL | Freq: Once | INTRAVENOUS | Status: AC
  Administered 2019-10-29: 500 mL via INTRAVENOUS

## 2019-10-29 MED ORDER — ONDANSETRON HCL 4 MG/2ML IJ SOLN
4.0000 mg | Freq: Once | INTRAMUSCULAR | Status: AC
  Administered 2019-10-29: 4 mg via INTRAVENOUS
  Filled 2019-10-29: qty 2

## 2019-10-29 MED ORDER — OXYCODONE-ACETAMINOPHEN 5-325 MG PO TABS: 1.0000 | ORAL_TABLET | ORAL | 0 refills | Status: DC | PRN

## 2019-10-29 MED ORDER — KETOROLAC TROMETHAMINE 15 MG/ML IJ SOLN
15.0000 mg | Freq: Once | INTRAMUSCULAR | Status: AC
  Administered 2019-10-29: 15 mg via INTRAVENOUS
  Filled 2019-10-29: qty 1

## 2019-10-29 MED ORDER — SODIUM CHLORIDE 0.9 % IV BOLUS
1000.0000 mL | Freq: Once | INTRAVENOUS | Status: AC
  Administered 2019-10-29: 1000 mL via INTRAVENOUS

## 2019-10-29 NOTE — ED Triage Notes (Signed)
Pt reports dull R sided abdominal pain. Nothing makes it worse or better. States that he hasn't had a bowel movement in several days and also thinks that he may have also passed a kidney stone 3 days ago. Reports urinating less, as well.

## 2019-10-29 NOTE — ED Provider Notes (Signed)
Patient signed to me by Dr. Ilda Basset pending urinalysis result.  Patient's urine negative for infection.  His pain is controlled here.  Will discharge   Lorre Nick, MD 10/29/19 [phone]

## 2019-10-29 NOTE — ED Provider Notes (Signed)
[facility]-EMERGENCY DEPT Provider Note  CSN: [id number] Arrival date & time: 10/29/19 2202  Chief Complaint(s) No chief complaint on file.  HPI Kyl Trumbull is a 69 y.o. male   The history is provided by the patient.  Abdominal Pain Pain location:  LUQ Pain quality: cramping   Pain radiates to:  Does not radiate Pain severity:  Moderate Onset quality:  Gradual Duration:  2 days Timing:  Intermittent Progression:  Waxing and waning Chronicity:  New Relieved by: passing a small amount of gas. Worsened by:  Eating Associated symptoms: constipation and nausea   Associated symptoms: no chest pain, no diarrhea, no fatigue and no fever   Associated symptoms comment:  Dry heaving  Patient initially felt that the pain was similar to prior renal stones.  However the pain has lasted longer than normal.  Past Medical History Past Medical History:  Diagnosis Date  . Asthma   . Bleeding disorder (HCC)    noted at age 55, was treated in [place], Patient never knew DX  . BPH (benign prostatic hyperplasia)   . Gynecomastia    eval by Dr. Abbey Chatters  . History of echocardiogram 3/13   no significant abn  . History of stress test 4/13   GXT- negative for ischemia  . Hyperlipidemia   . Hypertension   . Kidney calculi   . Nephrolithiasis   . PONV (postoperative nausea and vomiting)    Patient Active Problem List   Diagnosis Date Noted  . Dyslipidemia, goal LDL below 70 01/29/2017  . Essential hypertension 12/11/2013   Home Medication(s) Prior to Admission medications   Medication Sig Start Date End Date Taking? Authorizing Provider  hydrochlorothiazide (MICROZIDE) 12.5 MG capsule Take 1 capsule by mouth once daily 06/24/19  Yes Lyn Records, MD  Multiple Vitamins-Minerals (HAIR SKIN AND NAILS FORMULA PO) Take 1 tablet by mouth daily.   Yes [provider]  Multiple Vitamins-Minerals (MULTIVITAMIN WITH MINERALS) tablet Take 1 tablet by mouth  daily.   Yes [provider]  rosuvastatin (CRESTOR) 10 MG tablet Take 1 tablet (10 mg total) by mouth daily. 07/08/19  Yes Lyn Records, MD  tamsulosin (FLOMAX) 0.4 MG CAPS capsule Take 0.4 mg by mouth daily.   Yes [provider]  ibuprofen (ADVIL) 600 MG tablet Take 1 tablet (600 mg total) by mouth every 6 (six) hours as needed. 10/29/19   Nira Conn, MD                                                                                                                                    Past Surgical History Past Surgical History:  Procedure Laterality Date  . CIRCUMCISION  age 37 yrs   Multiple transfusions  . MULTIPLE TOOTH EXTRACTIONS  Age 52   hospitalized due to bleeding disorder--transfusions   Family History Family History  Problem Relation Age of Onset  . CAD  Father        stent  . COPD Father   . Colon cancer Mother   . Heart disease Mother     Social History Social History   Tobacco Use  . Smoking status: Never Smoker  . Smokeless tobacco: Never Used  . Tobacco comment: Never Used Tobacco  Substance Use Topics  . Alcohol use: Yes    Alcohol/week: 0.0 standard drinks    Comment: occasionally  . Drug use: No   Allergies Shellfish allergy  Review of Systems Review of Systems  Constitutional: Negative for fatigue and fever.  Cardiovascular: Negative for chest pain.  Gastrointestinal: Positive for abdominal pain, constipation and nausea. Negative for diarrhea.   All other systems are reviewed and are negative for acute change except as noted in the HPI  Physical Exam Vital Signs  I have reviewed the triage vital signs BP (!) 149/92 (BP Location: Right Arm)   Pulse 80   Temp 98.1 F (36.7 C) (Oral)   Resp 16   Ht 5' 9.5" (1.765 m)   Wt 78.5 kg   SpO2 99%   BMI 25.18 kg/m   Physical Exam Vitals reviewed.  Constitutional:      General: He is not in acute distress.    Appearance: He is well-developed. He is not  diaphoretic.  HENT:     Head: Normocephalic and atraumatic.     Jaw: No trismus.     Right Ear: External ear normal.     Left Ear: External ear normal.     Nose: Nose normal.  Eyes:     General: No scleral icterus.    Conjunctiva/sclera: Conjunctivae normal.  Neck:     Trachea: Phonation normal.  Cardiovascular:     Rate and Rhythm: Normal rate and regular rhythm.  Pulmonary:     Effort: Pulmonary effort is normal. No respiratory distress.     Breath sounds: No stridor.  Abdominal:     General: There is no distension.     Tenderness: There is abdominal tenderness (mild discomfort) in the periumbilical area and left upper quadrant. There is no guarding or rebound.  Musculoskeletal:        General: Normal range of motion.     Cervical back: Normal range of motion.  Neurological:     Mental Status: He is alert and oriented to person, place, and time.  Psychiatric:        Behavior: Behavior normal.     ED Results and Treatments Labs (all labs ordered are listed, but only abnormal results are displayed) Labs Reviewed  COMPREHENSIVE METABOLIC PANEL - Abnormal; Notable for the following components:      Result Value   Potassium 3.4 (*)    Glucose, Bld 115 (*)    BUN 25 (*)    Creatinine, Ser 1.72 (*)    GFR calc non Af Amer 39 (*)    GFR calc Af Amer 46 (*)    All other components within normal limits  CBC WITH DIFFERENTIAL/PLATELET  LIPASE, BLOOD  URINALYSIS, ROUTINE W REFLEX MICROSCOPIC  EKG  EKG Interpretation  Date/Time:    Ventricular Rate:    PR Interval:    QRS Duration:   QT Interval:    QTC Calculation:   R Axis:     Text Interpretation:        Radiology CT Renal Stone Study  Result Date: 10/29/2019 CLINICAL DATA:  Left flank pain. EXAM: CT ABDOMEN AND PELVIS WITHOUT CONTRAST TECHNIQUE: Multidetector CT imaging of the abdomen and  pelvis was performed following the standard protocol without IV contrast. COMPARISON:  10/10/11 FINDINGS: Lower chest:  No contributory findings. Hepatobiliary: No focal liver abnormality.No evidence of biliary obstruction or stone. Pancreas: Unremarkable. Spleen: Unremarkable. Adrenals/Urinary Tract: Negative adrenals. Left hydroureteronephrosis and renal expansion due to a 2 mm UVJ calculus. Unremarkable bladder. Stomach/Bowel: No obstruction. No appendicitis. Mild left colonic diverticulosis. Vascular/Lymphatic: No acute vascular abnormality. No mass or adenopathy. Reproductive:No pathologic findings. Other: No ascites or pneumoperitoneum.  Small fatty umbilical hernia Musculoskeletal: Severe lumbar disc degeneration with dextroscoliosis and L3-4 rightward translation. Degenerative disease has progressed since a 2016 lumbar MRI. IMPRESSION: Obstructing 2 mm left UVJ calculus. Electronically Signed   By: Marnee Spring M.D.   On: 10/29/2019 06:21    Pertinent labs & imaging results that were available during my care of the patient were reviewed by me and considered in my medical decision making (see chart for details).  Medications Ordered in ED Medications  ketorolac (TORADOL) 15 MG/ML injection 15 mg (has no administration in time range)  sodium chloride 0.9 % bolus 1,000 mL (0 mLs Intravenous Stopped 10/29/19 0704)  ondansetron (ZOFRAN) injection 4 mg (4 mg Intravenous Given 10/29/19 0535)  alum & mag hydroxide-simeth (MAALOX/MYLANTA) 200-200-20 MG/5ML suspension 30 mL (30 mLs Oral Given 10/29/19 0535)  fentaNYL (SUBLIMAZE) injection 50 mcg (50 mcg Intravenous Given 10/29/19 0620)  sodium chloride 0.9 % bolus 500 mL (500 mLs Intravenous New Bag/Given 10/29/19 0716)                                                                                                                                    Procedures Procedures  (including critical care time)  Medical Decision Making / ED Course I have reviewed  the nursing notes for this encounter and the patient's prior records (if available in EHR or on provided paperwork).   Suhas Kendrix was evaluated in Emergency Department on 10/29/2019 for the symptoms described in the history of present illness. He was evaluated in the context of the global COVID-19 pandemic, which necessitated consideration that the patient might be at risk for infection with the SARS-CoV-2 virus that causes COVID-19. Institutional protocols and algorithms that pertain to the evaluation of patients at risk for COVID-19 are in a state of rapid change based on information released by regulatory bodies including the CDC and federal and state organizations. These policies and algorithms were followed during the patient's care in the ED.  Work-up notable for 2 mm  left UVJ stone.  Mild renal sufficiency.  UA pending.  Patient provided with IV fluids.  Took home dose of Flomax.  Patient care turned over to Dr Freida Busman. Patient case and results discussed in detail; please see their note for further ED managment.          Final Clinical Impression(s) / ED Diagnoses Final diagnoses:  Left ureteral stone      This chart was dictated using voice recognition software.  Despite best efforts to proofread,  errors can occur which can change the documentation meaning.   Nira Conn, MD 10/29/19 [phone]

## 2019-10-29 NOTE — Discharge Instructions (Addendum)
Follow-up with your urologist this week. °

## 2019-10-29 NOTE — ED Notes (Signed)
Per Burley Saver, patient can take his home flomax with a sip of water and verbal for 500 NS bolus.

## 2019-11-18 DIAGNOSIS — Z0000 Encounter for general adult medical examination without abnormal findings: Secondary | ICD-10-CM | POA: Diagnosis not present

## 2019-11-18 DIAGNOSIS — N400 Enlarged prostate without lower urinary tract symptoms: Secondary | ICD-10-CM | POA: Diagnosis not present

## 2019-11-18 DIAGNOSIS — I10 Essential (primary) hypertension: Secondary | ICD-10-CM | POA: Diagnosis not present

## 2020-06-08 DIAGNOSIS — Z23 Encounter for immunization: Secondary | ICD-10-CM | POA: Diagnosis not present

## 2020-06-24 ENCOUNTER — Other Ambulatory Visit: Payer: Self-pay | Admitting: Interventional Cardiology

## 2020-06-26 NOTE — Progress Notes (Signed)
Telehealth Visit     Virtual Visit via Telephone Note   This visit type was conducted due to national recommendations for restrictions regarding the COVID-19 Pandemic (e.g. social distancing) in an effort to limit this patient's exposure and mitigate transmission in our community.  Due to his co-morbid illnesses, this patient is at least at moderate risk for complications without adequate follow up.  This format is felt to be most appropriate for this patient at this time.  The patient did not have access to video technology/had technical difficulties with video requiring transitioning to audio format only (telephone).  All issues noted in this document were discussed and addressed.  No physical exam could be performed with this format.  Please refer to the patient's chart for his  consent to telehealth for [facility].   Evaluation Performed:  Follow-up visit   The patient was identified using 2 identifiers.   This visit type was conducted due to national recommendations for restrictions regarding the COVID-19 Pandemic (e.g. social distancing).  This format is felt to be most appropriate for this patient at this time.  All issues noted in this document were discussed and addressed.  No physical exam was performed (except for noted visual exam findings with Video Visits).  Please refer to the patient's chart (MyChart message for video visits and phone note for telephone visits) for the patient's consent to telehealth for [facility].  Date:  06/29/2020   ID:  Jonathan Ramirez, DOB Sep 27, 1950, MRN [id number]  Patient Location:  Outside Home  Provider location:   [facility]  PCP:  Lewis Moccasin, MD  Cardiologist:  Katrinka Blazing Electrophysiologist:  None   Chief Complaint:  Follow up  History of Present Illness:    Jonathan Ramirez is a 69 y.o. male who presents via audio/video conferencing for a telehealth visit today.  Seen for Dr. Katrinka Blazing.   He has a history of HTN and HLD.    Last seen in November of 2020 and felt to be doing well. Does competitive biking. Endurance starting to decrease due to age. No evidence of CAD.   Unknown if the patient had symptoms concerning for COVID-19 infection (fever, chills, cough, or new shortness of breath).   To be seen by by telephone call. He has consented for this visit. He had to go outside to get adequate cell service. Spoke with the CMA.   Did not answer any of my attempts to complete the visit.    Past Medical History:  Diagnosis Date  . Asthma   . Bleeding disorder (HCC)    noted at age 13, was treated in [place], Patient never knew DX  . BPH (benign prostatic hyperplasia)   . Gynecomastia    eval by Dr. Abbey Chatters  . History of echocardiogram 3/13   no significant abn  . History of stress test 4/13   GXT- negative for ischemia  . Hyperlipidemia   . Hypertension   . Kidney calculi   . Nephrolithiasis   . PONV (postoperative nausea and vomiting)    Past Surgical History:  Procedure Laterality Date  . CIRCUMCISION  age 60 yrs   Multiple transfusions  . MULTIPLE TOOTH EXTRACTIONS  Age 49   hospitalized due to bleeding disorder--transfusions     Current Meds  Medication Sig  . hydrochlorothiazide (MICROZIDE) 12.5 MG capsule Take 1 capsule by mouth once daily  . ibuprofen (ADVIL) 600 MG tablet Take 1 tablet (600 mg total) by mouth every 6 (six) hours as  needed.  . Multiple Vitamins-Minerals (HAIR SKIN AND NAILS FORMULA PO) Take 1 tablet by mouth daily.  . Multiple Vitamins-Minerals (MULTIVITAMIN WITH MINERALS) tablet Take 1 tablet by mouth daily.  . ondansetron (ZOFRAN ODT) 8 MG disintegrating tablet Take 1 tablet (8 mg total) by mouth every 8 (eight) hours as needed for nausea or vomiting.  Marland Kitchen oxyCODONE-acetaminophen (PERCOCET/ROXICET) 5-325 MG tablet Take 1-2 tablets by mouth every 4 (four) hours as needed for severe pain.  . rosuvastatin (CRESTOR) 10 MG tablet Take 1 tablet by mouth once daily  .  sildenafil (REVATIO) 20 MG tablet Take 40 mg by mouth daily.  . tamsulosin (FLOMAX) 0.4 MG CAPS capsule Take 0.4 mg by mouth daily.     Allergies:   Shellfish allergy   Social History   Tobacco Use  . Smoking status: Never Smoker  . Smokeless tobacco: Never Used  . Tobacco comment: Never Used Tobacco  Substance Use Topics  . Alcohol use: Yes    Alcohol/week: 0.0 standard drinks    Comment: occasionally  . Drug use: No     Family Hx: The patient's family history includes CAD in his father; COPD in his father; Colon cancer in his mother; Heart disease in his mother.  ROS:   Please see the history of present illness.    Objective:    Vital Signs:  Ht 5' 9.5" (1.765 m)   Wt 178 lb (80.7 kg)   BMI 25.91 kg/m    Wt Readings from Last 3 Encounters:  06/29/20 178 lb (80.7 kg)  10/29/19 173 lb (78.5 kg)  06/19/19 181 lb 9.6 oz (82.4 kg)      Labs/Other Tests and Data Reviewed:    Lab Results  Component Value Date   WBC 8.0 10/29/2019   HGB 13.4 10/29/2019   HCT 40.2 10/29/2019   PLT 222 10/29/2019   GLUCOSE 115 (H) 10/29/2019   CHOL 135 08/28/2019   TRIG 71 08/28/2019   HDL 49 08/28/2019   LDLCALC 72 08/28/2019   ALT 23 10/29/2019   AST 34 10/29/2019   NA 139 10/29/2019   K 3.4 (L) 10/29/2019   CL 104 10/29/2019   CREATININE 1.72 (H) 10/29/2019   BUN 25 (H) 10/29/2019   CO2 27 10/29/2019   INR 1.15 08/27/2014        BNP (last 3 results) No results for input(s): BNP in the last 8760 hours.  ProBNP (last 3 results) No results for input(s): PROBNP in the last 8760 hours.    Prior CV studies:    The following studies were reviewed today:  GXT Study Highlights 01/2017    Blood pressure demonstrated a hypertensive response to exercise.  Horizontal ST segment depression ST segment depression of 0.5 mm was noted during stress in the V6, III and aVF leads, and returning to baseline after 1-5 minutes of recovery.   No conclusive evidence for  exercise induced ischemic ST segment changes. Low risk stress ECG. There are mild baseline ST changes (due to LVH?) with mild nonspecific worsening during exercise, not reaching criteria for ischemic change.     ASSESSMENT & PLAN:    1. HTN  2. HLD   Patient Risk:   After full review of this patient's clinical status, I feel that they are at least moderate risk at this time.  Time:   Today, I have spent 0 minutes with the patient with telehealth technology discussing the above issues.     Medication Adjustments/Labs and Tests Ordered: Current medicines  are reviewed at length with the patient today.  Concerns regarding medicines are outlined above.   Tests Ordered: No orders of the defined types were placed in this encounter.   Medication Changes: No orders of the defined types were placed in this encounter.   Disposition:     Patient is agreeable to this plan and will call if any problems develop in the interim.   Avelina Laine, NP  06/29/2020 8:50 AM    [facility]

## 2020-06-29 ENCOUNTER — Telehealth: Payer: Self-pay | Admitting: *Deleted

## 2020-06-29 ENCOUNTER — Other Ambulatory Visit: Payer: Self-pay

## 2020-06-29 ENCOUNTER — Telehealth (INDEPENDENT_AMBULATORY_CARE_PROVIDER_SITE_OTHER): Payer: Medicare Other | Admitting: Nurse Practitioner

## 2020-06-29 ENCOUNTER — Encounter: Payer: Self-pay | Admitting: Nurse Practitioner

## 2020-06-29 VITALS — Ht 69.5 in | Wt 178.0 lb

## 2020-06-29 DIAGNOSIS — Z79899 Other long term (current) drug therapy: Secondary | ICD-10-CM

## 2020-06-29 DIAGNOSIS — E785 Hyperlipidemia, unspecified: Secondary | ICD-10-CM

## 2020-06-29 DIAGNOSIS — I10 Essential (primary) hypertension: Secondary | ICD-10-CM

## 2020-06-29 NOTE — Telephone Encounter (Signed)
  Patient Consent for Virtual Visit         Jonathan Ramirez has provided verbal consent on 06/29/2020 for a virtual visit (video or telephone).   CONSENT FOR VIRTUAL VISIT FOR:  Jonathan Ramirez  By participating in this virtual visit I agree to the following:  I hereby voluntarily request, consent and authorize [facility] and its employed or contracted physicians, Producer, television/film/video, nurse practitioners or other licensed health care professionals (the Practitioner), to provide me with telemedicine health care services (the "Services") as deemed necessary by the treating Practitioner. I acknowledge and consent to receive the Services by the Practitioner via telemedicine. I understand that the telemedicine visit will involve communicating with the Practitioner through live audiovisual communication technology and the disclosure of certain medical information by electronic transmission. I acknowledge that I have been given the opportunity to request an in-person assessment or other available alternative prior to the telemedicine visit and am voluntarily participating in the telemedicine visit.  I understand that I have the right to withhold or withdraw my consent to the use of telemedicine in the course of my care at any time, without affecting my right to future care or treatment, and that the Practitioner or I may terminate the telemedicine visit at any time. I understand that I have the right to inspect all information obtained and/or recorded in the course of the telemedicine visit and may receive copies of available information for a reasonable fee.  I understand that some of the potential risks of receiving the Services via telemedicine include:  Marland Kitchen Delay or interruption in medical evaluation due to technological equipment failure or disruption; . Information transmitted may not be sufficient (e.g. poor resolution of images) to allow for appropriate medical decision making by the Practitioner;  and/or  . In rare instances, security protocols could fail, causing a breach of personal health information.  Furthermore, I acknowledge that it is my responsibility to provide information about my medical history, conditions and care that is complete and accurate to the best of my ability. I acknowledge that Practitioner's advice, recommendations, and/or decision may be based on factors not within their control, such as incomplete or inaccurate data provided by me or distortions of diagnostic images or specimens that may result from electronic transmissions. I understand that the practice of medicine is not an exact science and that Practitioner makes no warranties or guarantees regarding treatment outcomes. I acknowledge that a copy of this consent can be made available to me via my patient portal [facility] MyChart), or I can request a printed copy by calling the office of [facility].    I understand that my insurance will be billed for this visit.   I have read or had this consent read to me. . I understand the contents of this consent, which adequately explains the benefits and risks of the Services being provided via telemedicine.  . I have been provided ample opportunity to ask questions regarding this consent and the Services and have had my questions answered to my satisfaction. . I give my informed consent for the services to be provided through the use of telemedicine in my medical care

## 2020-06-29 NOTE — Patient Instructions (Addendum)
After Visit Summary:  We will be checking the following labs today - NONE   Medication Instructions:    Continue with your current medicines.    If you need a refill on your cardiac medications before your next appointment, please call your pharmacy.     Testing/Procedures To Be Arranged:  N/A  Follow-Up:   See Dr. Smith in one year -  You will receive a reminder letter in the mail two months in advance. If you don't receive a letter, please call our office to schedule the follow-up appointment.     At [facility], you and your health needs are our priority.  As part of our continuing mission to provide you with exceptional heart care, we have created designated Provider Care Teams.  These Care Teams include your primary Cardiologist (physician) and Advanced Practice Providers (APPs -  Physician Assistants and Nurse Practitioners) who all work together to provide you with the care you need, when you need it.  Special Instructions:  . Stay safe, wash your hands for at least 20 seconds and wear a mask when needed.  . It was good to talk with you today.    Call the [facility] office at [phone] if you have any questions, problems or concerns.       

## 2020-07-20 ENCOUNTER — Telehealth: Payer: Self-pay | Admitting: Interventional Cardiology

## 2020-07-20 NOTE — Telephone Encounter (Signed)
STAT if patient feels like he/she is going to faint   1) Are you dizzy now? No   2) Do you feel faint or have you passed out? Feels faint, but has not passed out.  3) Do you have any other symptoms? Elevated HR and BP   4) Have you checked your HR and BP (record if available)?   07/13/20 138/82 HR 68 07/19/20 128/80 HR 102 07/20/20 128/81 HR 74 121/80 HR 73 (BP reading while otp before meds) 159/94 HR 67  Last episode of Dizziness yesterday around 3 pm. HR was 102 at time of episode with normal BP reading.

## 2020-07-20 NOTE — Telephone Encounter (Signed)
Returned call to patient who reports he had episode of fast heart rate and feeling dizzy that occurred yesterday. States these episodes have been occurring intermittently x couple of months but yesterday he felt like he was going to pass out despite lying down; reports HR was 102 bpm. He has been a competitive cyclist and continues to ride regularly. Resting HR normally 60-70 bpm and has gotten up to 200 bpm or higher at times when he is riding. He reports occasional dizziness and fatigue, denies irregular HR or chest discomfort. I asked about hx of a fib and he states he thinks it was seen on a monitor in the distant past. I advised that he should be evaluated by one of our providers and get a 12-lead ECG. He is agreeable to plan. He is scheduled to see Jorja Loa, PA tomorrow in [facility]. He was given details of location and time of appointment and given the parking code. He thanked me for the help.

## 2020-07-21 ENCOUNTER — Encounter (HOSPITAL_COMMUNITY): Payer: Self-pay | Admitting: Physician Assistant

## 2020-07-21 ENCOUNTER — Other Ambulatory Visit: Payer: Self-pay

## 2020-07-21 ENCOUNTER — Ambulatory Visit (HOSPITAL_COMMUNITY)
Admission: RE | Admit: 2020-07-21 | Discharge: 2020-07-21 | Disposition: A | Discharge status: Home / Self Care | Payer: Medicare Other | Source: Ambulatory Visit | Attending: Physician Assistant | Admitting: Physician Assistant

## 2020-07-21 VITALS — BP 120/70 | HR 86 | Ht 69.5 in | Wt 179.2 lb

## 2020-07-21 DIAGNOSIS — R42 Dizziness and giddiness: Secondary | ICD-10-CM | POA: Diagnosis not present

## 2020-07-21 DIAGNOSIS — I10 Essential (primary) hypertension: Secondary | ICD-10-CM | POA: Diagnosis not present

## 2020-07-21 DIAGNOSIS — E785 Hyperlipidemia, unspecified: Secondary | ICD-10-CM | POA: Diagnosis not present

## 2020-07-21 DIAGNOSIS — R002 Palpitations: Secondary | ICD-10-CM | POA: Diagnosis not present

## 2020-07-21 DIAGNOSIS — Z7901 Long term (current) use of anticoagulants: Secondary | ICD-10-CM | POA: Insufficient documentation

## 2020-07-21 NOTE — Progress Notes (Signed)
Primary Care Physician: Lewis Moccasin, MD Primary Cardiologist: Dr Katrinka Blazing Primary Electrophysiologist: none Referring Physician: Dr Smith/[facility] St triage    Jonathan Ramirez is a 69 y.o. male with a history of HTN and HLD who presents for consultation in the [facility]. The patient reports he believes he wore a cardiac monitor in the distant past which showed "extra beats" but not afib. Patient has a CHADS2VASC score of 2. Over the last couple of months he has been having heart racing symptoms at rest. The episodes are occurring more frequently, now a couple times per week, and lasting for several minutes at a time. He is able to check his heart rate with his home BP machine which shows rates in low 100s during the episodes. He does have some associated dizziness. Of note, he is a competitive cyclist.  Today, he denies symptoms of chest pain, shortness of breath, orthopnea, PND, lower extremity edema, dizziness, presyncope, syncope, snoring, daytime somnolence, bleeding, or neurologic sequela. The patient is tolerating medications without difficulties and is otherwise without complaint today.    Atrial Fibrillation Risk Factors:  he does not have symptoms or diagnosis of sleep apnea. he does not have a history of rheumatic fever. he does not have a history of alcohol use. The patient does not have a history of early familial atrial fibrillation or other arrhythmias.  he has a BMI of Body mass index is 26.08 kg/m.Marland Kitchen Filed Weights   07/21/20 1423  Weight: 81.3 kg    Family History  Problem Relation Age of Onset  . CAD Father        stent  . COPD Father   . Colon cancer Mother   . Heart disease Mother      Atrial Fibrillation Management history:  Previous antiarrhythmic drugs: none Previous cardioversions: none Previous ablations: none CHADS2VASC score: 2 Anticoagulation history: none   Past Medical History:  Diagnosis Date  . Asthma   .  Bleeding disorder (HCC)    noted at age 24, was treated in [place], Patient never knew DX  . BPH (benign prostatic hyperplasia)   . Gynecomastia    eval by Dr. Abbey Chatters  . History of echocardiogram 3/13   no significant abn  . History of stress test 4/13   GXT- negative for ischemia  . Hyperlipidemia   . Hypertension   . Kidney calculi   . Nephrolithiasis   . PONV (postoperative nausea and vomiting)    Past Surgical History:  Procedure Laterality Date  . CIRCUMCISION  age 61 yrs   Multiple transfusions  . MULTIPLE TOOTH EXTRACTIONS  Age 51   hospitalized due to bleeding disorder--transfusions    Current Outpatient Medications  Medication Sig Dispense Refill  . hydrochlorothiazide (MICROZIDE) 12.5 MG capsule Take 1 capsule by mouth once daily 90 capsule 0  . Multiple Vitamins-Minerals (HAIR SKIN AND NAILS FORMULA PO) Take 1 tablet by mouth daily.    . Multiple Vitamins-Minerals (MULTIVITAMIN WITH MINERALS) tablet Take 1 tablet by mouth daily.    . rosuvastatin (CRESTOR) 10 MG tablet Take 1 tablet by mouth once daily 90 tablet 0  . tamsulosin (FLOMAX) 0.4 MG CAPS capsule Take 0.4 mg by mouth daily.     No current facility-administered medications for this encounter.    Allergies  Allergen Reactions  . Shellfish Allergy Anaphylaxis    Social History   Socioeconomic History  . Marital status: Divorced    Spouse name: Not on file  .  Number of children: Not on file  . Years of education: Not on file  . Highest education level: Not on file  Occupational History  . Not on file  Tobacco Use  . Smoking status: Never Smoker  . Smokeless tobacco: Never Used  . Tobacco comment: Never Used Tobacco  Substance and Sexual Activity  . Alcohol use: Yes    Alcohol/week: 1.0 - 2.0 standard drink    Types: 1 - 2 Standard drinks or equivalent per week    Comment: occasionally  . Drug use: No  . Sexual activity: Not on file  Other Topics Concern  . Not on file  Social  History Narrative  . Not on file   Social Determinants of Health   Financial Resource Strain: Not on file  Food Insecurity: Not on file  Transportation Needs: Not on file  Physical Activity: Not on file  Stress: Not on file  Social Connections: Not on file  Intimate Partner Violence: Not on file     ROS- All systems are reviewed and negative except as per the HPI above.  Physical Exam: Vitals:   07/21/20 1423  BP: 120/70  Pulse: 86  Weight: 81.3 kg  Height: 5' 9.5" (1.765 m)    GEN- The patient is well appearing, alert and oriented x 3 today.   Head- normocephalic, atraumatic Eyes-  Sclera clear, conjunctiva pink Ears- hearing intact Oropharynx- clear Neck- supple  Lungs- Clear to ausculation bilaterally, normal work of breathing Heart- Regular rate and rhythm, no murmurs, rubs or gallops  GI- soft, NT, ND, + BS Extremities- no clubbing, cyanosis, or edema MS- no significant deformity or atrophy Skin- no rash or lesion Psych- euthymic mood, full affect Neuro- strength and sensation are intact  Wt Readings from Last 3 Encounters:  07/21/20 81.3 kg  06/29/20 80.7 kg  10/29/19 78.5 kg    EKG today demonstrates SR HR 86, LVH, PR 168, QRS 84, QTc 435  Epic records are reviewed at length today  CHA2DS2-VASc Score = 2  The patient's score is based upon: CHF History: No HTN History: Yes Diabetes History: No Stroke History: No Vascular Disease History: No Age Score: 1 Gender Score: 0  {This patient does not have a recorded CHADS2-VASc score.   Click here to calculate score.   Then [facility] your note.       :[id number]}    ASSESSMENT AND PLAN: 1. Palpitations Unclear etiology. No h/o afib. Will place 14 day Zio patch to evaluate for arrhythmias.   2. HTN Stable, no changes today.   Follow up in the AF clinic in one month.    Jorja Loa PA-C Afib Clinic [facility] [address] [place], [place] [address] [phone] 07/21/2020 3:01  PM

## 2020-08-11 DIAGNOSIS — R002 Palpitations: Secondary | ICD-10-CM | POA: Diagnosis not present

## 2020-08-20 ENCOUNTER — Ambulatory Visit (HOSPITAL_COMMUNITY): Payer: Medicare Other | Admitting: Physician Assistant

## 2020-08-27 ENCOUNTER — Ambulatory Visit (HOSPITAL_COMMUNITY)
Admission: RE | Admit: 2020-08-27 | Discharge: 2020-08-27 | Disposition: A | Discharge status: Home / Self Care | Payer: Medicare Other | Source: Ambulatory Visit | Attending: Physician Assistant | Admitting: Physician Assistant

## 2020-08-27 ENCOUNTER — Other Ambulatory Visit: Payer: Self-pay

## 2020-08-27 ENCOUNTER — Encounter (HOSPITAL_COMMUNITY): Payer: Self-pay | Admitting: Physician Assistant

## 2020-08-27 VITALS — BP 108/60 | HR 71 | Ht 69.5 in | Wt 177.2 lb

## 2020-08-27 DIAGNOSIS — I471 Supraventricular tachycardia: Secondary | ICD-10-CM | POA: Diagnosis not present

## 2020-08-27 DIAGNOSIS — I10 Essential (primary) hypertension: Secondary | ICD-10-CM | POA: Diagnosis not present

## 2020-08-27 DIAGNOSIS — Z79899 Other long term (current) drug therapy: Secondary | ICD-10-CM | POA: Insufficient documentation

## 2020-08-27 DIAGNOSIS — Z7901 Long term (current) use of anticoagulants: Secondary | ICD-10-CM | POA: Diagnosis not present

## 2020-08-27 NOTE — Progress Notes (Signed)
Primary Care Physician: Lewis Moccasin, MD Primary Cardiologist: Dr Katrinka Blazing Primary Electrophysiologist: none Referring Physician: Dr Smith/Ch St triage    Jonathan Ramirez Age is a 70 y.o. male with a history of HTN and HLD who presents for follow up in the [facility]. The patient reports he believes he wore a cardiac monitor in the distant past which showed "extra beats" but not afib. Patient has a CHADS2VASC score of 2. Over the last couple of months he has been having heart racing symptoms at rest. The episodes are occurring more frequently, now a couple times per week, and lasting for several minutes at a time. He is able to check his heart rate with his home BP machine which shows rates in low 100s during the episodes. He does have some associated dizziness. Of note, he is a competitive cyclist.  On follow up today, he reports that he has done well since his last visit. His heart monitor showed brief SVT but on review with patient, he states he was biking during that time, likely represents sinus tachycardia. His symptom trigger did not correlate with any arrhythmias.   Today, he denies symptoms of chest pain, shortness of breath, orthopnea, PND, lower extremity edema, dizziness, presyncope, syncope, snoring, daytime somnolence, bleeding, or neurologic sequela. The patient is tolerating medications without difficulties and is otherwise without complaint today.    Atrial Fibrillation Risk Factors:  he does not have symptoms or diagnosis of sleep apnea. he does not have a history of rheumatic fever. he does not have a history of alcohol use. The patient does not have a history of early familial atrial fibrillation or other arrhythmias.  he has a BMI of Body mass index is 25.79 kg/m.Marland Kitchen Filed Weights   08/27/20 1343  Weight: 80.4 kg    Family History  Problem Relation Age of Onset  . CAD Father        stent  . COPD Father   . Colon cancer Mother   .  Heart disease Mother      Atrial Fibrillation Management history:  Previous antiarrhythmic drugs: none Previous cardioversions: none Previous ablations: none CHADS2VASC score: 2 Anticoagulation history: none   Past Medical History:  Diagnosis Date  . Asthma   . Bleeding disorder (HCC)    noted at age 17, was treated in [place], Patient never knew DX  . BPH (benign prostatic hyperplasia)   . Gynecomastia    eval by Dr. Abbey Chatters  . History of echocardiogram 3/13   no significant abn  . History of stress test 4/13   GXT- negative for ischemia  . Hyperlipidemia   . Hypertension   . Kidney calculi   . Nephrolithiasis   . PONV (postoperative nausea and vomiting)    Past Surgical History:  Procedure Laterality Date  . CIRCUMCISION  age 49 yrs   Multiple transfusions  . MULTIPLE TOOTH EXTRACTIONS  Age 63   hospitalized due to bleeding disorder--transfusions    Current Outpatient Medications  Medication Sig Dispense Refill  . amoxicillin (AMOXIL) 500 MG capsule Take 500 mg by mouth 3 (three) times daily.    . hydrochlorothiazide (MICROZIDE) 12.5 MG capsule Take 1 capsule by mouth once daily 90 capsule 0  . HYDROcodone-acetaminophen (NORCO/VICODIN) 5-325 MG tablet TAKE 1 TABLET BY MOUTH EVERY 4 TO 6 HOURS AS NEEDED FOR PAIN    . Multiple Vitamins-Minerals (HAIR SKIN AND NAILS FORMULA PO) Take 1 tablet by mouth daily.    . Multiple  Vitamins-Minerals (MULTIVITAMIN WITH MINERALS) tablet Take 1 tablet by mouth daily.    . promethazine (PHENERGAN) 25 MG tablet Take 25 mg by mouth as needed.    . rosuvastatin (CRESTOR) 10 MG tablet Take 1 tablet by mouth once daily 90 tablet 0  . tamsulosin (FLOMAX) 0.4 MG CAPS capsule Take 0.4 mg by mouth daily.     No current facility-administered medications for this encounter.    Allergies  Allergen Reactions  . Shellfish Allergy Anaphylaxis    Social History   Socioeconomic History  . Marital status: Divorced    Spouse name:  Not on file  . Number of children: Not on file  . Years of education: Not on file  . Highest education level: Not on file  Occupational History  . Not on file  Tobacco Use  . Smoking status: Never Smoker  . Smokeless tobacco: Never Used  . Tobacco comment: Never Used Tobacco  Substance and Sexual Activity  . Alcohol use: Yes    Alcohol/week: 1.0 - 2.0 standard drink    Types: 1 - 2 Standard drinks or equivalent per week    Comment: occasionally  . Drug use: No  . Sexual activity: Not on file  Other Topics Concern  . Not on file  Social History Narrative  . Not on file   Social Determinants of Health   Financial Resource Strain: Not on file  Food Insecurity: Not on file  Transportation Needs: Not on file  Physical Activity: Not on file  Stress: Not on file  Social Connections: Not on file  Intimate Partner Violence: Not on file     ROS- All systems are reviewed and negative except as per the HPI above.  Physical Exam: Vitals:   08/27/20 1343  BP: 108/60  Pulse: 71  Weight: 80.4 kg  Height: 5' 9.5" (1.765 m)    GEN- The patient is well appearing, alert and oriented x 3 today.   HEENT-head normocephalic, atraumatic, sclera clear, conjunctiva pink, hearing intact, trachea midline. Lungs- Clear to ausculation bilaterally, normal work of breathing Heart- Regular rate and rhythm, no murmurs, rubs or gallops  GI- soft, NT, ND, + BS Extremities- no clubbing, cyanosis, or edema MS- no significant deformity or atrophy Skin- no rash or lesion Psych- euthymic mood, full affect Neuro- strength and sensation are intact   Wt Readings from Last 3 Encounters:  08/27/20 80.4 kg  07/21/20 81.3 kg  06/29/20 80.7 kg    EKG today demonstrates  SR, LVH Vent. rate 71 BPM PR interval 182 ms QRS duration 88 ms QT/QTc 366/397 ms   Epic records are reviewed at length today  CHA2DS2-VASc Score = 2  The patient's score is based upon: CHF History: No HTN History:  Yes Diabetes History: No Stroke History: No Vascular Disease History: No Age Score: 1 Gender Score: 0      ASSESSMENT AND PLAN: 1. SVT Long RP tachycardia noted on heart monitor, likely sinus tach given he was exercising heavily at that time. Also had some other brief episodes of SVT. No afib detected.  2. HTN Stable, no changes today.   Follow up with Dr Katrinka Blazing or APP. AF clinic as needed.    Jorja Loa PA-C Afib Clinic [facility] [address] [place], [place] [address] [phone] 08/27/2020 1:50 PM

## 2020-09-21 ENCOUNTER — Other Ambulatory Visit: Payer: Self-pay

## 2020-09-21 MED ORDER — ROSUVASTATIN CALCIUM 10 MG PO TABS: 10.0000 mg | ORAL_TABLET | Freq: Every day | ORAL | 3 refills | Status: DC

## 2020-09-21 MED ORDER — HYDROCHLOROTHIAZIDE 12.5 MG PO CAPS: 12.5000 mg | ORAL_CAPSULE | Freq: Every day | ORAL | 3 refills | Status: DC

## 2020-09-21 NOTE — Telephone Encounter (Signed)
Pt's medications were sent to pt's pharmacy as requested. Confirmation received.  

## 2020-10-26 NOTE — Progress Notes (Addendum)
Cardiology Office Note:    Date:  10/27/2020   ID:  Jonathan Ramirez, DOB 08/24/1950, MRN [id number]  PCP:  Jonathan Moccasin, MD   [facility]  Cardiologist:  Jonathan Noe, MD   Electrophysiologist:  None       Referring MD: Jonathan Moccasin, MD   Chief Complaint:  Follow-up (Palpitations )    Patient Profile:     Jonathan Ramirez is a 70 y.o. male with:   Hypertension   Hyperlipidemia   BPH  Palpitations  Monitor 07/2020: Long RP Tachycardia (likely sinus tachy); short SVT  Prior CV studies:  LONG TERM MONITOR(3-7 DAYS) HOOK UP AND INTERPRETATION 08/11/2020 Narrative Patch Wear Time:  13 days and 16 hours (2021-12-28T15:31:46-0500 to 2022-01-11T08:25:18-0500)  Patient had a min HR of 52 bpm, max HR of 235 bpm, and avg HR of 83 bpm. Predominant underlying rhythm was Sinus Rhythm. 5 Supraventricular Tachycardia runs occurred, the run with the fastest interval lasting 17 beats with a max rate of 235 bpm (avg 207 bpm); the run with the fastest interval was also the longest. Isolated SVEs were rare (<1.0%), SVE Couplets were rare (<1.0%), and SVE Triplets were rare (<1.0%). Isolated VEs were rare (<1.0%), and no VE Couplets or VE Triplets were present.  Predominant rhythm is sinus Several episodes of long RP tachycardia are noted.  These could be atrial tachycardia vs sinus tachycardia.   ETT 02/21/17 No conclusive evidence for exercise induced ischemic ST segment changes. Low risk stress ECG. There are mild baseline ST changes (due to LVH?) with mild nonspecific worsening during exercise, not reaching criteria for ischemic change.     History of Present Illness:    Jonathan Ramirez was recently seen in the atrial fibrillation clinic in 12/21 for palpitations.  An event monitor demonstrated long RP tachycardia which was felt to likely be sinus tachycardia as the patient was exercising vigorously at that time.  He had some brief episodes of SVT  recognized.  No atrial fibrillation was documented.  He was last seen in the atrial fibrillation clinic in 08/2020.    He notes he had an episode of prolonged palpitations that prompted his visit to the AFib clinic.  He has occasional palpitations now but not as sustained.  He exercises regularly.  He still rides a bike some.  He has not had chest pain, significant shortness of breath, orthopnea, leg edema, syncope.           Past Medical History:  Diagnosis Date  . Asthma   . Bleeding disorder (HCC)    noted at age 59, was treated in [place], Patient never knew DX  . BPH (benign prostatic hyperplasia)   . Gynecomastia    eval by Dr. Abbey Ramirez  . History of echocardiogram 3/13   no significant abn  . History of stress test 4/13   GXT- negative for ischemia  . Hyperlipidemia   . Hypertension   . Kidney calculi   . Nephrolithiasis   . PONV (postoperative nausea and vomiting)     Current Medications: Current Meds  Medication Sig  . hydrochlorothiazide (MICROZIDE) 12.5 MG capsule Take 1 capsule (12.5 mg total) by mouth daily.  . metoprolol tartrate (LOPRESSOR) 25 MG tablet Take 0.5 tablets (12.5 mg total) by mouth as needed. For palpitations  . rosuvastatin (CRESTOR) 10 MG tablet Take 1 tablet (10 mg total) by mouth daily.  . tamsulosin (FLOMAX) 0.4 MG CAPS capsule Take 0.4 mg by mouth daily.  Allergies:   Shellfish allergy   Social History   Tobacco Use  . Smoking status: Never Smoker  . Smokeless tobacco: Never Used  . Tobacco comment: Never Used Tobacco  Substance Use Topics  . Alcohol use: Yes    Alcohol/week: 1.0 - 2.0 standard drink    Types: 1 - 2 Standard drinks or equivalent per week    Comment: occasionally  . Drug use: No     Family Hx: The patient's family history includes CAD in his father; COPD in his father; Colon cancer in his mother; Heart disease in his mother.  ROS   EKGs/Labs/Other Test Reviewed:    EKG:  EKG is not ordered today.  The  ekg ordered today demonstrates n/a EKG from 08/27/20 reviewed and demonstrated normal sinus rhythm, HR 71, normal axis, LVH, repol abnl, QTc 397  Recent Labs: 10/29/2019: ALT 23; BUN 25; Creatinine, Ser 1.72; Hemoglobin 13.4; Platelets 222; Potassium 3.4; Sodium 139   Recent Lipid Panel Lab Results  Component Value Date/Time   CHOL 135 08/28/2019 08:34 AM   TRIG 71 08/28/2019 08:34 AM   HDL 49 08/28/2019 08:34 AM   CHOLHDL 2.8 08/28/2019 08:34 AM   LDLCALC 72 08/28/2019 08:34 AM      Risk Assessment/Calculations:      Physical Exam:    VS:  BP 130/62   Pulse 76   Ht 5' 9.5" (1.765 m)   Wt 175 lb (79.4 kg)   SpO2 98%   BMI 25.47 kg/m     Wt Readings from Last 3 Encounters:  10/27/20 175 lb (79.4 kg)  08/27/20 177 lb 3.2 oz (80.4 kg)  07/21/20 179 lb 3.2 oz (81.3 kg)     Constitutional:      Appearance: Healthy appearance. Not in distress.  Neck:     Thyroid: No thyromegaly.     Vascular: JVD normal.  Pulmonary:     Effort: Pulmonary effort is normal.     Breath sounds: No wheezing. No rales.  Cardiovascular:     Normal rate. Regular rhythm. Normal S1. Normal S2.     Murmurs: There is no murmur.  Edema:    Peripheral edema absent.  Abdominal:     Palpations: Abdomen is soft. There is no hepatomegaly.  Skin:    General: Skin is warm and dry.  Neurological:     General: No focal deficit present.     Mental Status: Alert and oriented to person, place and time.     Cranial Nerves: Cranial nerves are intact.          ASSESSMENT & PLAN:    1. Essential hypertension The patient's blood pressure is controlled on his current regimen.  Continue current therapy.    2. Palpitations 3. SVT (supraventricular tachycardia) (HCC) He still exercises a lot and some high HRs were noted while he was riding a bike.  He did have some short SVT episodes and it sounds like he had some symptoms with this.  He has not had any significant or prolonged episodes.  He notes he has  always had a faster HR than everyone else when he is out riding his bike. At this point, I do not think he needs to be on a daily beta-blocker.  But, I do think a PRN beta-blocker would be a good to have.  I will also check his electrolytes and thyroid.  We also discussed doing Valsalva maneuvers if needed.    -Metoprolol tartrate 12.5 mg once daily prn for  palpitations  -BMET, Mg2+, TSH  -F/u 6 mos  4. Abnormal electrocardiogram (ECG) (EKG) I reviewed several of his prior EKGs.  He has LVH on his EKG with repol abnormality on some of them. Since he is having palpitations and evidence of SVT, I will arrange a 2-D echocardiogram to rule out structural heart disease.           Dispo:  Return in about 4 months (around 02/26/2021) for Routine Follow Up w Dr. Katrinka Blazing.   Medication Adjustments/Labs and Tests Ordered: Current medicines are reviewed at length with the patient today.  Concerns regarding medicines are outlined above.  Tests Ordered: Orders Placed This Encounter  Procedures  . Basic Metabolic Panel (BMET)  . Magnesium  . TSH  . ECHOCARDIOGRAM COMPLETE   Medication Changes: Meds ordered this encounter  Medications  . metoprolol tartrate (LOPRESSOR) 25 MG tablet    Sig: Take 0.5 tablets (12.5 mg total) by mouth as needed. For palpitations    Dispense:  15 tablet    Refill:  2    Signed, Tereso Newcomer, PA-C  10/27/2020 4:49 PM    [facility] [address] [place], [place], [place]  [address] Phone: [phone]; Fax: [phone]

## 2020-10-27 ENCOUNTER — Ambulatory Visit (INDEPENDENT_AMBULATORY_CARE_PROVIDER_SITE_OTHER): Payer: Medicare Other | Admitting: Physician Assistant

## 2020-10-27 ENCOUNTER — Other Ambulatory Visit: Payer: Self-pay

## 2020-10-27 ENCOUNTER — Encounter: Payer: Self-pay | Admitting: Physician Assistant

## 2020-10-27 VITALS — BP 130/62 | HR 76 | Ht 69.5 in | Wt 175.0 lb

## 2020-10-27 DIAGNOSIS — R9431 Abnormal electrocardiogram [ECG] [EKG]: Secondary | ICD-10-CM | POA: Diagnosis not present

## 2020-10-27 DIAGNOSIS — I10 Essential (primary) hypertension: Secondary | ICD-10-CM

## 2020-10-27 DIAGNOSIS — I471 Supraventricular tachycardia: Secondary | ICD-10-CM | POA: Diagnosis not present

## 2020-10-27 DIAGNOSIS — R002 Palpitations: Secondary | ICD-10-CM | POA: Diagnosis not present

## 2020-10-27 MED ORDER — METOPROLOL TARTRATE 25 MG PO TABS: 12.5000 mg | ORAL_TABLET | ORAL | 2 refills | Status: DC | PRN

## 2020-10-27 NOTE — Patient Instructions (Signed)
Medication Instructions:  Your physician has recommended you make the following change in your medication:   1. Lopressor ( metoprolol) one half tablet by mouth ( 12.5 mg ) as needed for palpitations.   *If you need a refill on your cardiac medications before your next appointment, please call your pharmacy*   Lab Work: Your physician recommends that you lab work: bmet/mag/tsh  If you have labs (blood work) drawn today and your tests are completely normal, you will receive your results only by: Marland Kitchen MyChart Message (if you have MyChart) OR . A paper copy in the mail If you have any lab test that is abnormal or we need to change your treatment, we will call you to review the results.   Testing/Procedures: Your physician has requested that you have an echocardiogram.  Monday, May 2 @ 7:20 am  Echocardiography is a painless test that uses sound waves to create images of your heart. It provides your doctor with information about the size and shape of your heart and how well your heart's chambers and valves are working. This procedure takes approximately one hour. There are no restrictions for this procedure.   Echocardiogram An echocardiogram is a test that uses sound waves (ultrasound) to produce images of the heart. Images from an echocardiogram can provide important information about:  Heart size and shape.  The size and thickness and movement of your heart's walls.  Heart muscle function and strength.  Heart valve function or if you have stenosis. Stenosis is when the heart valves are too narrow.  If blood is flowing backward through the heart valves (regurgitation).  A tumor or infectious growth around the heart valves.  Areas of heart muscle that are not working well because of poor blood flow or injury from a heart attack.  Aneurysm detection. An aneurysm is a weak or damaged part of an artery wall. The wall bulges out from the normal force of blood pumping through the  body. Tell a health care provider about:  Any allergies you have.  All medicines you are taking, including vitamins, herbs, eye drops, creams, and over-the-counter medicines.  Any blood disorders you have.  Any surgeries you have had.  Any medical conditions you have.  Whether you are pregnant or may be pregnant. What are the risks? Generally, this is a safe test. However, problems may occur, including an allergic reaction to dye (contrast) that may be used during the test. What happens before the test? No specific preparation is needed. You may eat and drink normally. What happens during the test?  You will take off your clothes from the waist up and put on a hospital gown.  Electrodes or electrocardiogram (ECG)patches may be placed on your chest. The electrodes or patches are then connected to a device that monitors your heart rate and rhythm.  You will lie down on a table for an ultrasound exam. A gel will be applied to your chest to help sound waves pass through your skin.  A handheld device, called a transducer, will be pressed against your chest and moved over your heart. The transducer produces sound waves that travel to your heart and bounce back (or "echo" back) to the transducer. These sound waves will be captured in real-time and changed into images of your heart that can be viewed on a video monitor. The images will be recorded on a computer and reviewed by your health care provider.  You may be asked to change positions or hold your breath  for a short time. This makes it easier to get different views or better views of your heart.  In some cases, you may receive contrast through an IV in one of your veins. This can improve the quality of the pictures from your heart. The procedure may vary among health care providers and hospitals.   What can I expect after the test? You may return to your normal, everyday life, including diet, activities, and medicines, unless your  health care provider tells you not to do that. Follow these instructions at home:  It is up to you to get the results of your test. Ask your health care provider, or the department that is doing the test, when your results will be ready.  Keep all follow-up visits. This is important. Summary  An echocardiogram is a test that uses sound waves (ultrasound) to produce images of the heart.  Images from an echocardiogram can provide important information about the size and shape of your heart, heart muscle function, heart valve function, and other possible heart problems.  You do not need to do anything to prepare before this test. You may eat and drink normally.  After the echocardiogram is completed, you may return to your normal, everyday life, unless your health care provider tells you not to do that. This information is not intended to replace advice given to you by your health care provider. Make sure you discuss any questions you have with your health care provider. Document Revised: 03/03/2020 Document Reviewed: 03/03/2020 Elsevier Patient Education  2021 Elsevier Inc.    Follow-Up: At [facility], you and your health needs are our priority.  As part of our continuing mission to provide you with exceptional heart care, we have created designated Provider Care Teams.  These Care Teams include your primary Cardiologist (physician) and Advanced Practice Providers (APPs -  Physician Assistants and Nurse Practitioners) who all work together to provide you with the care you need, when you need it.  We recommend signing up for the patient portal called "MyChart".  Sign up information is provided on this After Visit Summary.  MyChart is used to connect with patients for Virtual Visits (Telemedicine).  Patients are able to view lab/test results, encounter notes, upcoming appointments, etc.  Non-urgent messages can be sent to your provider as well.   To learn more about what you can do with  MyChart, go to [email or web address].    Your next appointment:   4 month(s), Wednesday, August 10 @ 9:00 am  The format for your next appointment:   In Person  Provider:   Verdis Prime, MD   Other Instructions -None

## 2020-10-28 ENCOUNTER — Telehealth: Payer: Self-pay | Admitting: *Deleted

## 2020-10-28 DIAGNOSIS — Z79899 Other long term (current) drug therapy: Secondary | ICD-10-CM

## 2020-10-28 DIAGNOSIS — E876 Hypokalemia: Secondary | ICD-10-CM

## 2020-10-28 LAB — BASIC METABOLIC PANEL
BUN/Creatinine Ratio: 14 (ref 10–24)
BUN: 13 mg/dL (ref 8–27)
CO2: 24 mmol/L (ref 20–29)
Calcium: 9.5 mg/dL (ref 8.6–10.2)
Chloride: 102 mmol/L (ref 96–106)
Creatinine, Ser: 0.93 mg/dL (ref 0.76–1.27)
Glucose: 100 mg/dL — ABNORMAL HIGH (ref 65–99)
Potassium: 3.7 mmol/L (ref 3.5–5.2)
Sodium: 142 mmol/L (ref 134–144)
eGFR: 88 mL/min/{1.73_m2} (ref >59)

## 2020-10-28 LAB — TSH: TSH: 1.21 u[IU]/mL (ref 0.450–4.500)

## 2020-10-28 LAB — MAGNESIUM: Magnesium: 1.9 mg/dL (ref 1.6–2.3)

## 2020-10-28 MED ORDER — POTASSIUM CHLORIDE ER 10 MEQ PO TBCR: 10.0000 meq | EXTENDED_RELEASE_TABLET | Freq: Every day | ORAL | 3 refills | Status: DC

## 2020-10-28 NOTE — Telephone Encounter (Signed)
-----   Message from Beatrice Lecher, [place] sent at 10/28/2020  5:15 PM EDT ----- TSH normal, Mg2+ normal.  K+ low normal. PLAN:  -Start K+ 10 mEq once daily -BMET in 1-2 weeks  Tereso Newcomer, PA-C    10/28/2020 5:12 PM

## 2020-10-28 NOTE — Telephone Encounter (Signed)
Informed patient of results and verbal understanding expressed. Pt will pick up K+ Rx tomorrow Pt will stop by office 4/18 for follow up blood work.

## 2020-11-09 ENCOUNTER — Other Ambulatory Visit: Payer: Self-pay

## 2020-11-09 ENCOUNTER — Other Ambulatory Visit: Payer: Medicare Other | Admitting: *Deleted

## 2020-11-09 DIAGNOSIS — E876 Hypokalemia: Secondary | ICD-10-CM

## 2020-11-09 DIAGNOSIS — Z79899 Other long term (current) drug therapy: Secondary | ICD-10-CM | POA: Diagnosis not present

## 2020-11-09 LAB — BASIC METABOLIC PANEL
BUN/Creatinine Ratio: 15 (ref 10–24)
BUN: 17 mg/dL (ref 8–27)
CO2: 26 mmol/L (ref 20–29)
Calcium: 10.1 mg/dL (ref 8.6–10.2)
Chloride: 101 mmol/L (ref 96–106)
Creatinine, Ser: 1.1 mg/dL (ref 0.76–1.27)
Glucose: 95 mg/dL (ref 65–99)
Potassium: 4.1 mmol/L (ref 3.5–5.2)
Sodium: 142 mmol/L (ref 134–144)
eGFR: 72 mL/min/{1.73_m2} (ref >59)

## 2020-11-18 DIAGNOSIS — R972 Elevated prostate specific antigen [PSA]: Secondary | ICD-10-CM | POA: Diagnosis not present

## 2020-11-20 DIAGNOSIS — I10 Essential (primary) hypertension: Secondary | ICD-10-CM | POA: Diagnosis not present

## 2020-11-20 DIAGNOSIS — N400 Enlarged prostate without lower urinary tract symptoms: Secondary | ICD-10-CM | POA: Diagnosis not present

## 2020-11-20 DIAGNOSIS — Z13220 Encounter for screening for lipoid disorders: Secondary | ICD-10-CM | POA: Diagnosis not present

## 2020-11-20 DIAGNOSIS — Z0000 Encounter for general adult medical examination without abnormal findings: Secondary | ICD-10-CM | POA: Diagnosis not present

## 2020-11-23 ENCOUNTER — Ambulatory Visit (HOSPITAL_COMMUNITY): Payer: Medicare Other | Attending: Cardiovascular Disease

## 2020-11-23 ENCOUNTER — Encounter (HOSPITAL_COMMUNITY): Payer: Self-pay

## 2020-11-23 ENCOUNTER — Encounter (HOSPITAL_COMMUNITY): Payer: Self-pay | Admitting: Physician Assistant

## 2020-11-23 NOTE — Progress Notes (Signed)
Verified appointment "no show" status with A. Carter at 07:27.  

## 2020-11-25 DIAGNOSIS — R3912 Poor urinary stream: Secondary | ICD-10-CM | POA: Diagnosis not present

## 2020-11-25 DIAGNOSIS — R972 Elevated prostate specific antigen [PSA]: Secondary | ICD-10-CM | POA: Diagnosis not present

## 2020-11-25 DIAGNOSIS — N401 Enlarged prostate with lower urinary tract symptoms: Secondary | ICD-10-CM | POA: Diagnosis not present

## 2020-11-25 DIAGNOSIS — N5201 Erectile dysfunction due to arterial insufficiency: Secondary | ICD-10-CM | POA: Diagnosis not present

## 2020-12-07 ENCOUNTER — Telehealth (HOSPITAL_COMMUNITY): Payer: Self-pay | Admitting: Physician Assistant

## 2020-12-07 NOTE — Telephone Encounter (Signed)
Just an FYI. We have made several attempts to contact this patient including sending a letter to schedule or reschedule their echocardiogram. We will be removing the patient from the echo WQ.  11/23/20 NO SHOWED-MAILED LETTER [facility]        Thank you  

## 2021-03-01 ENCOUNTER — Telehealth: Payer: Self-pay | Admitting: Interventional Cardiology

## 2021-03-01 MED ORDER — POTASSIUM CHLORIDE ER 10 MEQ PO TBCR: 10.0000 meq | EXTENDED_RELEASE_TABLET | Freq: Every day | ORAL | 1 refills | Status: DC

## 2021-03-01 NOTE — Telephone Encounter (Signed)
Pt has been made aware that we sent in Potassium refill, as the Hydrochlorothiazide and Rosuvastatin has refills up until 08/2021.

## 2021-03-01 NOTE — Telephone Encounter (Signed)
  *  STAT* If patient is at the pharmacy, call can be transferred to refill team.   1. Which medications need to be refilled? (please list name of each medication and dose if known) potassium chloride (KLOR-CON) 10 MEQ tablet hydrochlorothiazide (MICROZIDE) 12.5 MG capsule rosuvastatin (CRESTOR) 10 MG tablet  2. Which pharmacy/location (including street and city if local pharmacy) is medication to be sent to? [facility] [address] [place], [place] - [address]  3. Do they need a 30 day or 90 day supply? 90 days  Pt is out of meds and needs refill today

## 2021-03-03 ENCOUNTER — Ambulatory Visit: Payer: Medicare Other | Admitting: Interventional Cardiology

## 2021-03-18 IMAGING — CT CT RENAL STONE PROTOCOL
2 of 4 series · 16 of 46 positions shown, 18 images · non-contrast
Comparison: 10/10/11

CLINICAL DATA: Left flank pain.

EXAM:
CT ABDOMEN AND PELVIS WITHOUT CONTRAST
TECHNIQUE: Multidetector CT imaging of the abdomen and pelvis was performed
following the standard protocol without IV contrast.

[Series 2: axial st · axial · 0.68mm/px · z∈[+1134,+1494]mm · 13 of 80 slices shown, 15 images]
[im 4/80  soft-tissue]
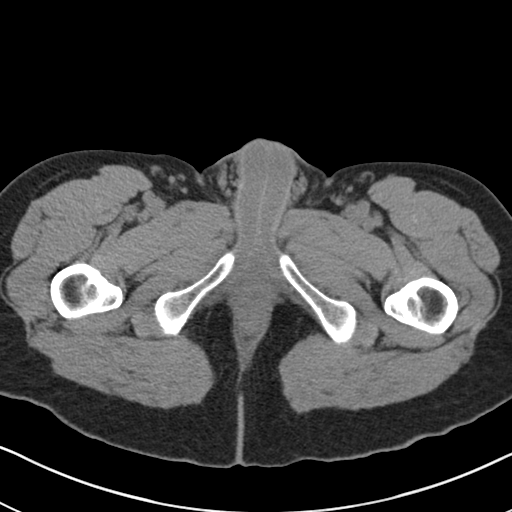
[im 4/80  bone]
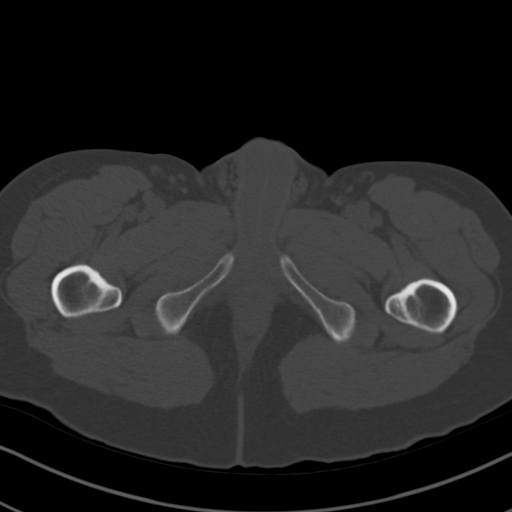
[im 12/80  soft-tissue]
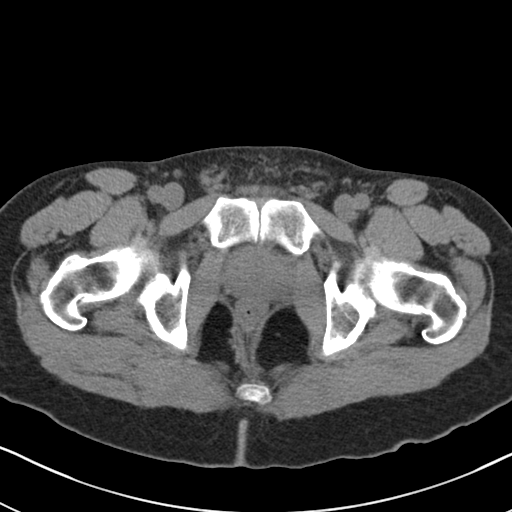
[im 16/80  soft-tissue]
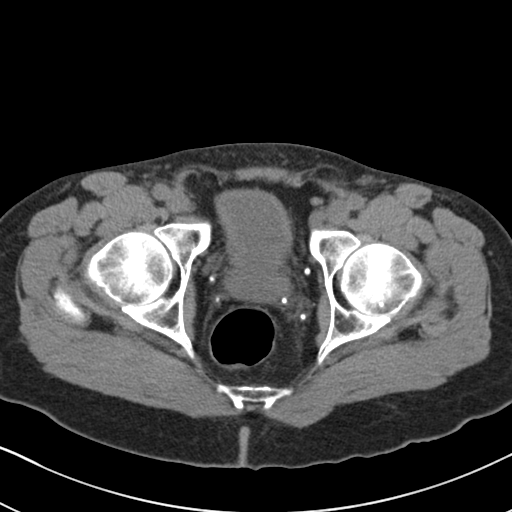
[im 24/80  soft-tissue]
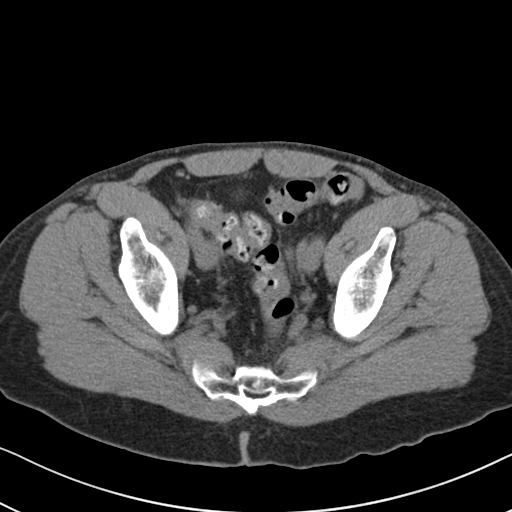
[im 28/80  soft-tissue]
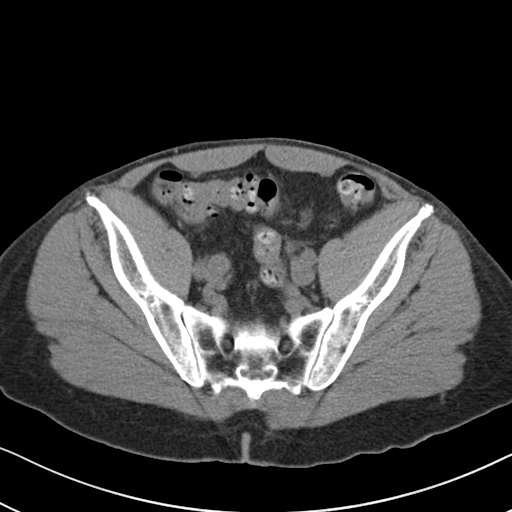
[im 36/80  soft-tissue]
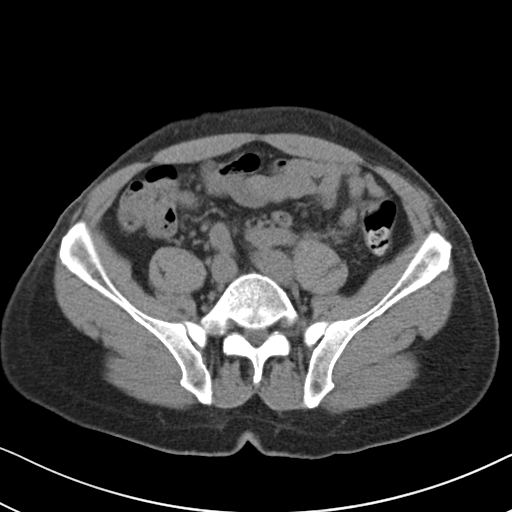
[im 40/80  soft-tissue]
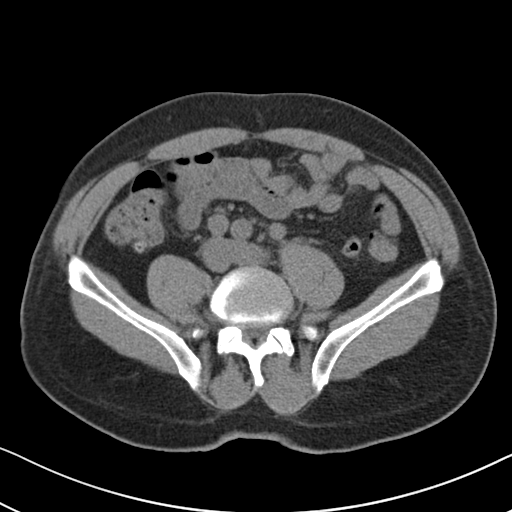
[im 44/80  soft-tissue]
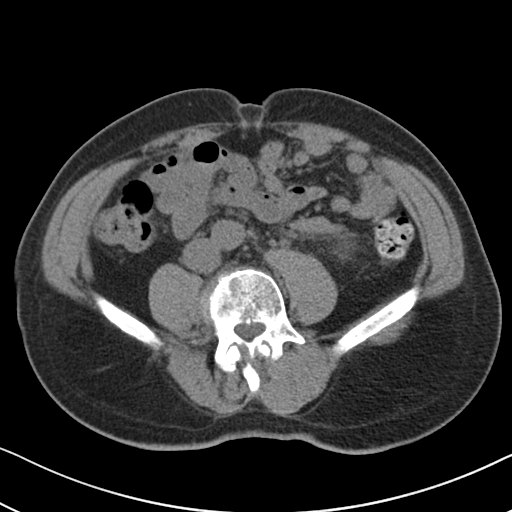
[im 52/80  soft-tissue]
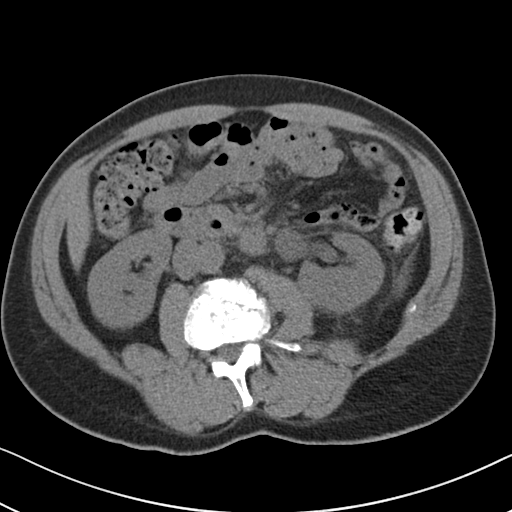
[im 52/80  bone]
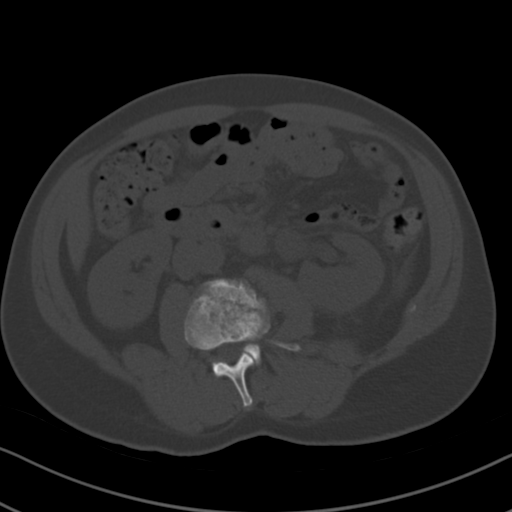
[im 56/80  soft-tissue]
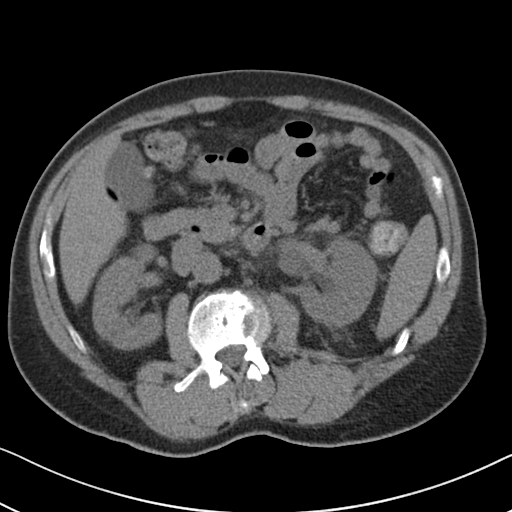
[im 64/80  soft-tissue]
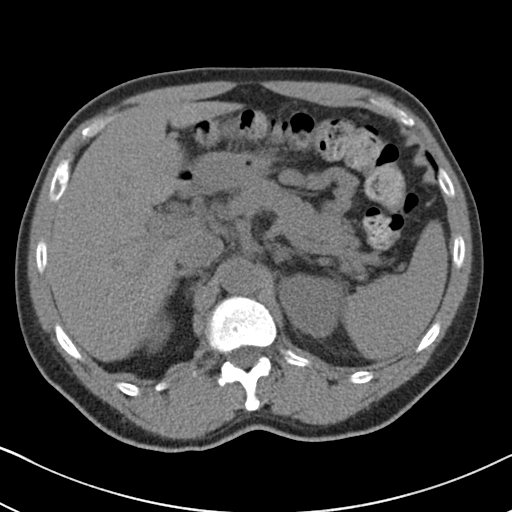
[im 68/80  soft-tissue]
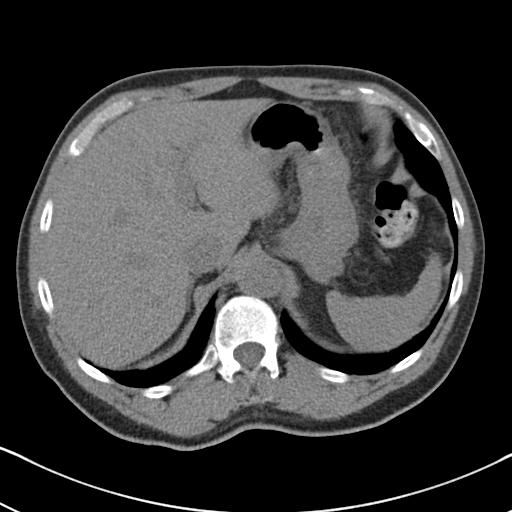
[im 76/80  soft-tissue]
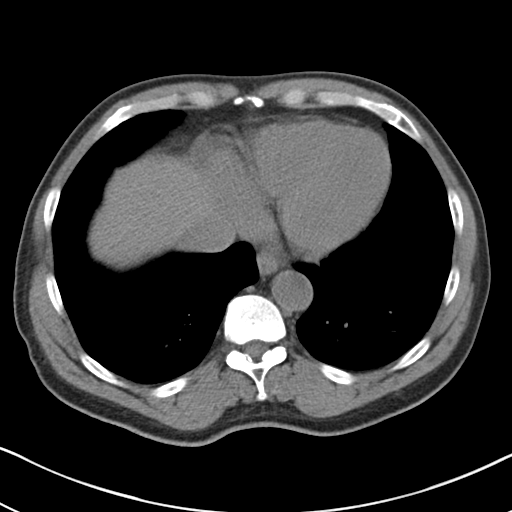

[Series 4: coronal · coronal · 0.62mm/px · 3 of 133 slices shown]
[im 45/133  soft-tissue]
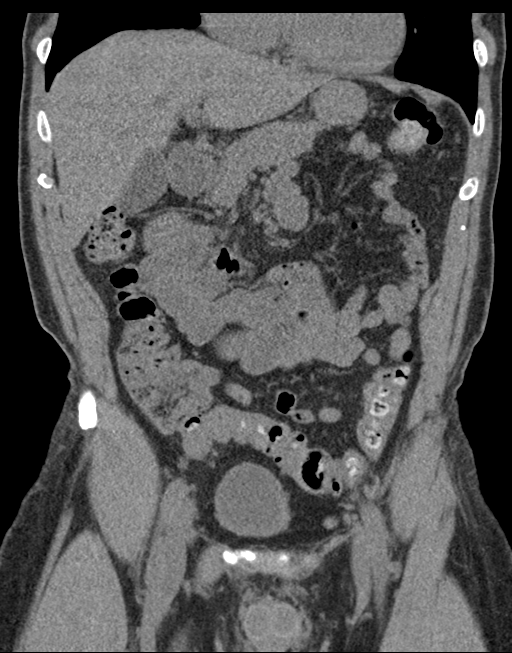
[im 59/133  soft-tissue]
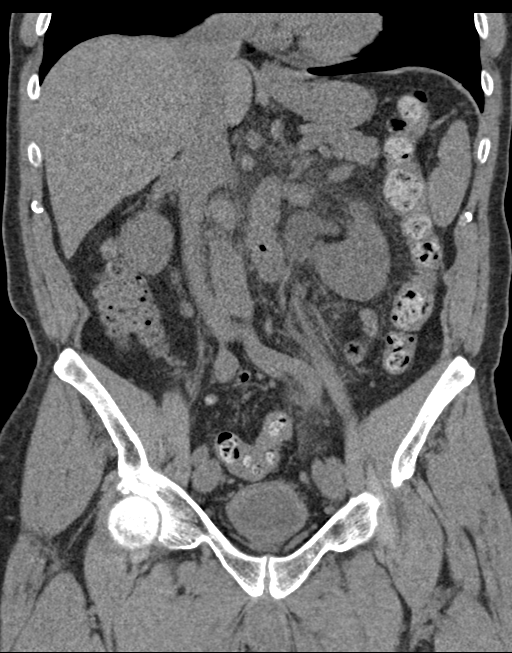
[im 74/133  soft-tissue]
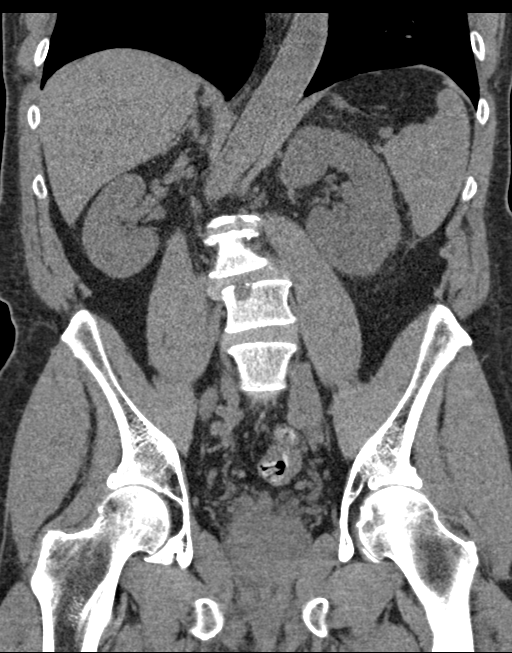

[16 of 46 positions shown; findings below may reference images not displayed]

FINDINGS: Lower chest:  No contributory findings.

Hepatobiliary: No focal liver abnormality.No evidence of biliary
obstruction or stone.

Pancreas: Unremarkable.

Spleen: Unremarkable.

Adrenals/Urinary Tract: Negative adrenals. Left
hydroureteronephrosis and renal expansion due to a 2 mm UVJ
calculus. Unremarkable bladder.

Stomach/Bowel: No obstruction. No appendicitis. Mild left colonic
diverticulosis.

Vascular/Lymphatic: No acute vascular abnormality. No mass or
adenopathy.

Reproductive:No pathologic findings.

Other: No ascites or pneumoperitoneum.  Small fatty umbilical hernia

Musculoskeletal: Severe lumbar disc degeneration with
dextroscoliosis and L3-4 rightward translation. Degenerative disease
has progressed since a 9089 lumbar MRI.
IMPRESSION: Obstructing 2 mm left UVJ calculus.

## 2021-06-01 NOTE — Progress Notes (Signed)
Cardiology Office Note:    Date:  06/02/2021   ID:  Jonathan Ramirez, DOB 03-01-1951, MRN [id number]  PCP:  Fanny Bien, MD  Cardiologist:  Sinclair Grooms, MD   Referring MD: Fanny Bien, MD   Chief Complaint  Patient presents with   Hypertension   Irregular Heart Beat    History of Present Illness:    Jonathan Ramirez is a 70 y.o. male with a hx of primary hypertension, hyperlipidemia, nonsustained SVT, and CV risk modification.  Monitor revealed brief SVT and PRN metoprolol prescribed by Richardson Dopp 10/27/2020.  The metoprolol smooth out the spontaneous episodes of tachycardia to the point that he feels back to normal and has discontinued metoprolol.  He has had no recurrent episodes.  Recently has been less active because he has been charged with keeping his youngest granddaughter every day because his daughter has to work.  The baby is 46 months old.  Is preventing him from doing bicycling and his own personal exercise  .  He has no cardiac complaints.  He denies angina.  He has not had syncope.  Past Medical History:  Diagnosis Date   Asthma    Bleeding disorder ([place])    noted at age 34, was treated in [place], Patient never knew DX   BPH (benign prostatic hyperplasia)    Gynecomastia    eval by Dr. Zella Richer   History of echocardiogram 3/13   no significant abn   History of stress test 4/13   GXT- negative for ischemia   Hyperlipidemia    Hypertension    Kidney calculi    Nephrolithiasis    PONV (postoperative nausea and vomiting)     Past Surgical History:  Procedure Laterality Date   CIRCUMCISION  age 63 yrs   Multiple transfusions   MULTIPLE TOOTH EXTRACTIONS  Age 60   hospitalized due to bleeding disorder--transfusions    Current Medications: Current Meds  Medication Sig   hydrochlorothiazide (MICROZIDE) 12.5 MG capsule Take 1 capsule (12.5 mg total) by mouth daily.   metoprolol tartrate (LOPRESSOR) 25 MG tablet Take 0.5 tablets  (12.5 mg total) by mouth as needed. For palpitations   potassium chloride (KLOR-CON) 10 MEQ tablet Take 1 tablet (10 mEq total) by mouth daily.   rosuvastatin (CRESTOR) 10 MG tablet Take 1 tablet (10 mg total) by mouth daily.   tamsulosin (FLOMAX) 0.4 MG CAPS capsule Take 0.4 mg by mouth daily.     Allergies:   Shellfish allergy   Social History   Socioeconomic History   Marital status: Divorced    Spouse name: Not on file   Number of children: Not on file   Years of education: Not on file   Highest education level: Not on file  Occupational History   Not on file  Tobacco Use   Smoking status: Never   Smokeless tobacco: Never   Tobacco comments:    Never Used Tobacco  Substance and Sexual Activity   Alcohol use: Yes    Alcohol/week: 1.0 - 2.0 standard drink    Types: 1 - 2 Standard drinks or equivalent per week    Comment: occasionally   Drug use: No   Sexual activity: Not on file  Other Topics Concern   Not on file  Social History Narrative   Not on file   Social Determinants of Health   Financial Resource Strain: Not on file  Food Insecurity: Not on file  Transportation Needs: Not on file  Physical  Activity: Not on file  Stress: Not on file  Social Connections: Not on file     Family History: The patient's family history includes CAD in his father; COPD in his father; Colon cancer in his mother; Heart disease in his mother.  ROS:   Please see the history of present illness.    Is having no musculoskeletal or other complaints.  He feels the potassium that was started by Nicki Reaper is what helped the heart dysrhythmia to dissipate.  All other systems reviewed and are negative.  EKGs/Labs/Other Studies Reviewed:    The following studies were reviewed today:  Seven Day Monitor 07/2020: Predominant rhythm is sinus Several episodes of long RP tachycardia are noted.  These could be atrial tachycardia vs sinus tachycardia.  EKG:  EKG normal sinus rhythm, nonspecific T  wave flattening, atrial abnormality, left.  When compared to prior from February 2022 axis is more leftward and T wave abnormality has resolved.  Recent Labs: 10/27/2020: Magnesium 1.9; TSH 1.210 11/09/2020: BUN 17; Creatinine, Ser 1.10; Potassium 4.1; Sodium 142  Recent Lipid Panel    Component Value Date/Time   CHOL 135 08/28/2019 0834   TRIG 71 08/28/2019 0834   HDL 49 08/28/2019 0834   CHOLHDL 2.8 08/28/2019 0834   LDLCALC 72 08/28/2019 0834    Physical Exam:    VS:  BP 130/70 (BP Location: Left Arm, Patient Position: Sitting, Cuff Size: Normal)   Pulse 62   Ht 5' 9.5" (1.765 m)   Wt 164 lb (74.4 kg)   SpO2 98%   BMI 23.87 kg/m     Wt Readings from Last 3 Encounters:  06/02/21 164 lb (74.4 kg)  10/27/20 175 lb (79.4 kg)  08/27/20 177 lb 3.2 oz (80.4 kg)     GEN: Healthy appearing. No acute distress HEENT: Normal NECK: No JVD. LYMPHATICS: No lymphadenopathy CARDIAC: No murmur. RRR no gallop, or edema. VASCULAR:  Normal Pulses. No bruits. RESPIRATORY:  Clear to auscultation without rales, wheezing or rhonchi  ABDOMEN: Soft, non-tender, non-distended, No pulsatile mass, MUSCULOSKELETAL: No deformity  SKIN: Warm and dry NEUROLOGIC:  Alert and oriented x 3 PSYCHIATRIC:  Normal affect   ASSESSMENT:    1. SVT (supraventricular tachycardia) ([place])   2. Essential hypertension   3. Abnormal electrocardiogram (ECG) (EKG)   4. Dyslipidemia, goal LDL below 70    PLAN:    In order of problems listed above:  Resolved.  He says that metoprolol will be capped and used as needed if he has recurrent tachycardia/palpitations. Blood pressure control is adequate. EKG has less T wave abnormality than the prior tracing.  He does have left atrial abnormality on the EKG.  He has prominent voltage.  Overall, her EKG appears improved. Continue Crestor 10 mg/day.  The most up-to-date LDL was 72 in February 2021.   Plan continued follow-up with primary care physician, Marda Stalker.  Medication Adjustments/Labs and Tests Ordered: Current medicines are reviewed at length with the patient today.  Concerns regarding medicines are outlined above.  Orders Placed This Encounter  Procedures   EKG 12-Lead   No orders of the defined types were placed in this encounter.   Patient Instructions  Medication Instructions:  Your physician recommends that you continue on your current medications as directed. Please refer to the Current Medication list given to you today.  *If you need a refill on your cardiac medications before your next appointment, please call your pharmacy*   Lab Work: None If you have labs (blood  work) drawn today and your tests are completely normal, you will receive your results only by: MyChart Message (if you have MyChart) OR A paper copy in the mail If you have any lab test that is abnormal or we need to change your treatment, we will call you to review the results.   Testing/Procedures: None   Follow-Up: At [facility], you and your health needs are our priority.  As part of our continuing mission to provide you with exceptional heart care, we have created designated Provider Care Teams.  These Care Teams include your primary Cardiologist (physician) and Advanced Practice Providers (APPs -  Physician Assistants and Nurse Practitioners) who all work together to provide you with the care you need, when you need it.  We recommend signing up for the patient portal called "MyChart".  Sign up information is provided on this After Visit Summary.  MyChart is used to connect with patients for Virtual Visits (Telemedicine).  Patients are able to view lab/test results, encounter notes, upcoming appointments, etc.  Non-urgent messages can be sent to your provider as well.   To learn more about what you can do with MyChart, go to [email or web address].    Your next appointment:   1 year(s)  The format for your next appointment:   In  Person  Provider:   Lesleigh Noe, MD     Other Instructions     Signed, Lesleigh Noe, MD  06/02/2021 2:55 PM    [facility]

## 2021-06-02 ENCOUNTER — Other Ambulatory Visit: Payer: Self-pay

## 2021-06-02 ENCOUNTER — Encounter: Payer: Self-pay | Admitting: Interventional Cardiology

## 2021-06-02 ENCOUNTER — Ambulatory Visit (INDEPENDENT_AMBULATORY_CARE_PROVIDER_SITE_OTHER): Payer: Medicare Other | Admitting: Interventional Cardiology

## 2021-06-02 VITALS — BP 130/70 | HR 62 | Ht 69.5 in | Wt 164.0 lb

## 2021-06-02 DIAGNOSIS — I10 Essential (primary) hypertension: Secondary | ICD-10-CM

## 2021-06-02 DIAGNOSIS — R9431 Abnormal electrocardiogram [ECG] [EKG]: Secondary | ICD-10-CM

## 2021-06-02 DIAGNOSIS — E785 Hyperlipidemia, unspecified: Secondary | ICD-10-CM

## 2021-06-02 DIAGNOSIS — I471 Supraventricular tachycardia: Secondary | ICD-10-CM | POA: Diagnosis not present

## 2021-06-02 NOTE — Patient Instructions (Signed)

## 2021-08-23 ENCOUNTER — Ambulatory Visit: Payer: Medicare Other | Admitting: Interventional Cardiology

## 2021-09-24 ENCOUNTER — Other Ambulatory Visit: Payer: Self-pay | Admitting: Interventional Cardiology

## 2021-10-15 ENCOUNTER — Other Ambulatory Visit: Payer: Self-pay | Admitting: Interventional Cardiology

## 2021-12-17 DIAGNOSIS — N401 Enlarged prostate with lower urinary tract symptoms: Secondary | ICD-10-CM | POA: Diagnosis not present

## 2021-12-17 DIAGNOSIS — N5201 Erectile dysfunction due to arterial insufficiency: Secondary | ICD-10-CM | POA: Diagnosis not present

## 2021-12-17 DIAGNOSIS — R350 Frequency of micturition: Secondary | ICD-10-CM | POA: Diagnosis not present

## 2021-12-17 DIAGNOSIS — R972 Elevated prostate specific antigen [PSA]: Secondary | ICD-10-CM | POA: Diagnosis not present

## 2022-07-05 ENCOUNTER — Other Ambulatory Visit: Payer: Self-pay | Admitting: Interventional Cardiology

## 2022-08-05 NOTE — Progress Notes (Signed)
Cardiology Office Note:    Date:  08/10/2022   ID:  Jonathan Ramirez, DOB Jun 30, 1951, MRN [id number]  PCP:  Lewis Moccasin, MD   [facility] Providers Cardiologist:  Lesleigh Noe, MD     Referring MD: Lewis Moccasin, MD   Chief Complaint: annual follow-up hyperlipidemia, palpitations  History of Present Illness:    Jonathan Ramirez is a very pleasant 72 y.o. male with a hx of   Hypertension  Hyperlipidemia  BPH Palpitations Monitor 07/2020: Long RP Tachycardia (likely sinus tachy); short SVT  [facility]en in [facility] for palpitations 06/2020. Cardiac monitor revealed long RP tachycardia felt likely to be sinus tachycardia as the patient was exercising vigorously at the time. He had brief episodes of SVT recognized, no atrial fibrillation was documented. He was prescribed prn metoprolol.   Last cardiology clinic visit was 06/02/2021 with Dr. Katrinka Blazing at which time he reported metoprolol had "smoothed" out spontaneous episodes of tachycardia and he had discontinued metoprolol due to feeling no recurrent episodes. EKG felt to have less T wave abnormality than prior tracing. He had no specific cardiac symptoms and was advised to return for 1 year follow-up.   Today, he is here alone for follow-up. Reports he is feeling well. Has not been as active recently due to taking care of of his great granddaughter who is almost 2 years old. Loves to cycle and used to cycle with very competitive riders, could keep up with 20 year olds. She lives with him full-time under protective custody. He denies chest pain, shortness of breath, lower extremity edema, fatigue, palpitations, melena, hematuria, hemoptysis, diaphoresis, weakness, presyncope, syncope, orthopnea, and PND.  He is not certain when he stopped taking metoprolol, but thinks he ran out of it.  No episodes of tachycardia or palpitations for some time. Thinks it is due to not exercising vigorously on a routine basis.  Past Medical History:   Diagnosis Date   Asthma    Bleeding disorder (HCC)    noted at age 71, was treated in [place], Patient never knew DX   BPH (benign prostatic hyperplasia)    Gynecomastia    eval by Dr. Abbey Chatters   History of echocardiogram 3/13   no significant abn   History of stress test 4/13   GXT- negative for ischemia   Hyperlipidemia    Hypertension    Kidney calculi    Nephrolithiasis    PONV (postoperative nausea and vomiting)     Past Surgical History:  Procedure Laterality Date   CIRCUMCISION  age 27 yrs   Multiple transfusions   MULTIPLE TOOTH EXTRACTIONS  Age 26   hospitalized due to bleeding disorder--transfusions    Current Medications: Current Meds  Medication Sig   tamsulosin (FLOMAX) 0.4 MG CAPS capsule Take 0.4 mg by mouth daily.   [DISCONTINUED] hydrochlorothiazide (MICROZIDE) 12.5 MG capsule Take 1 capsule by mouth once daily   [DISCONTINUED] potassium chloride (KLOR-CON) 10 MEQ tablet Take 1 tablet by mouth once daily   [DISCONTINUED] rosuvastatin (CRESTOR) 10 MG tablet Take 1 tablet by mouth once daily     Allergies:   Shellfish allergy   Social History   Socioeconomic History   Marital status: Divorced    Spouse name: Not on file   Number of children: Not on file   Years of education: Not on file   Highest education level: Not on file  Occupational History   Not on file  Tobacco Use   Smoking status:  Never   Smokeless tobacco: Never   Tobacco comments:    Never Used Tobacco  Substance and Sexual Activity   Alcohol use: Yes    Alcohol/week: 1.0 - 2.0 standard drink of alcohol    Types: 1 - 2 Standard drinks or equivalent per week    Comment: occasionally   Drug use: No   Sexual activity: Not on file  Other Topics Concern   Not on file  Social History Narrative   Not on file   Social Determinants of Health   Financial Resource Strain: Not on file  Food Insecurity: Not on file  Transportation Needs: Not on file  Physical Activity: Not on  file  Stress: Not on file  Social Connections: Not on file     Family History: The patient's family history includes CAD in his father; COPD in his father; Colon cancer in his mother; Heart disease in his mother.  ROS:   Please see the history of present illness.  All other systems reviewed and are negative.  Labs/Other Studies Reviewed:    The following studies were reviewed today:  LONG TERM MONITOR(3-7 DAYS) HOOK UP AND INTERPRETATION 08/11/2020 Narrative Patch Wear Time:  13 days and 16 hours (2021-12-28T15:31:46-0500 to 2022-01-11T08:25:18-0500)   Patient had a min HR of 52 bpm, max HR of 235 bpm, and avg HR of 83 bpm. Predominant underlying rhythm was Sinus Rhythm. 5 Supraventricular Tachycardia runs occurred, the run with the fastest interval lasting 17 beats with a max rate of 235 bpm (avg 207 bpm); the run with the fastest interval was also the longest. Isolated SVEs were rare (<1.0%), SVE Couplets were rare (<1.0%), and SVE Triplets were rare (<1.0%). Isolated VEs were rare (<1.0%), and no VE Couplets or VE Triplets were present.   Predominant rhythm is sinus Several episodes of long RP tachycardia are noted.  These could be atrial tachycardia vs sinus tachycardia.   ETT 02/21/17 No conclusive evidence for exercise induced ischemic ST segment changes. Low risk stress ECG. There are mild baseline ST changes (due to LVH?) with mild nonspecific worsening during exercise, not reaching criteria for ischemic change.    Recent Labs: No results found for requested labs within last 365 days.  Recent Lipid Panel    Component Value Date/Time   CHOL 135 08/28/2019 0834   TRIG 71 08/28/2019 0834   HDL 49 08/28/2019 0834   CHOLHDL 2.8 08/28/2019 0834   LDLCALC 72 08/28/2019 0834     Risk Assessment/Calculations:        Physical Exam:    VS:  BP 132/64   Pulse 72   Ht 5' 9.5" (1.765 m)   Wt 173 lb (78.5 kg)   SpO2 98%   BMI 25.18 kg/m     Wt Readings from Last 3  Encounters:  08/10/22 173 lb (78.5 kg)  06/02/21 164 lb (74.4 kg)  10/27/20 175 lb (79.4 kg)     GEN:  Well nourished, well developed in no acute distress HEENT: Normal NECK: No JVD; No carotid bruits CARDIAC: RRR, no murmurs, rubs, gallops RESPIRATORY:  Clear to auscultation without rales, wheezing or rhonchi  ABDOMEN: Soft, non-tender, non-distended MUSCULOSKELETAL:  No edema; No deformity. 2+ pedal pulses, equal bilaterally SKIN: Warm and dry NEUROLOGIC:  Alert and oriented x 3 PSYCHIATRIC:  Normal affect   EKG:  EKG is ordered today.  The ekg ordered today demonstrates normal sinus rhythm at 72 bpm, ST elevation in leads V2-V3 more pronounced than in most recent tracing,  reviewed with Dr. Angelena Form, felt to be consistent with repolarization abnormality   Diagnoses:    1. SVT (supraventricular tachycardia)   2. Essential hypertension   3. Dyslipidemia, goal LDL below 70    Assessment and Plan:     SVT/Palpitations: Quiescent at this time. Cannot recall last time he had symptoms, more common when he was exercising vigorously. We will refill metoprolol for him to keep on hand.   Hypertension: BP is well controlled. Will check kidney function and electrolytes today.  Hyperlipidemia: No recent lipid panel to review.  Will get fasting lipid panel today.  Continue rosuvastatin.   Disposition: 1 year with me  Medication Adjustments/Labs and Tests Ordered: Current medicines are reviewed at length with the patient today.  Concerns regarding medicines are outlined above.  Orders Placed This Encounter  Procedures   Comp Met (CMET)   Lipid Profile   EKG 12-Lead   Meds ordered this encounter  Medications   hydrochlorothiazide (MICROZIDE) 12.5 MG capsule    Sig: Take 1 capsule (12.5 mg total) by mouth daily.    Dispense:  90 capsule    Refill:  3   metoprolol tartrate (LOPRESSOR) 25 MG tablet    Sig: Take 0.5 tablets (12.5 mg total) by mouth as needed. For palpitations     Dispense:  30 tablet    Refill:  6   potassium chloride (KLOR-CON) 10 MEQ tablet    Sig: Take 1 tablet (10 mEq total) by mouth daily.    Dispense:  90 tablet    Refill:  3   rosuvastatin (CRESTOR) 10 MG tablet    Sig: Take 1 tablet (10 mg total) by mouth daily.    Dispense:  90 tablet    Refill:  3    Patient Instructions  Medication Instructions:   Your physician recommends that you continue on your current medications as directed. Please refer to the Current Medication list given to you today.   *If you need a refill on your cardiac medications before your next appointment, please call your pharmacy*   Lab Work:  TODAY!!!!! CMET/LIPID  If you have labs (blood work) drawn today and your tests are completely normal, you will receive your results only by: [place] (if you have MyChart) OR A paper copy in the mail If you have any lab test that is abnormal or we need to change your treatment, we will call you to review the results.   Testing/Procedures:  None ordered.   Follow-Up: At [facility], you and your health needs are our priority.  As part of our continuing mission to provide you with exceptional heart care, we have created designated Provider Care Teams.  These Care Teams include your primary Cardiologist (physician) and Advanced Practice Providers (APPs -  Physician Assistants and Nurse Practitioners) who all work together to provide you with the care you need, when you need it.  We recommend signing up for the patient portal called "MyChart".  Sign up information is provided on this After Visit Summary.  MyChart is used to connect with patients for Virtual Visits (Telemedicine).  Patients are able to view lab/test results, encounter notes, upcoming appointments, etc.  Non-urgent messages can be sent to your provider as well.   To learn more about what you can do with MyChart, go to [email or web address].    Your next appointment:   1  month(s)  Provider:   Christen Bame, NP         Other Instructions  Your physician wants you to follow-up in: 1 year with Lebron Conners.  You will receive a reminder letter in the mail two months in advance. If you don't receive a letter, please call our office to schedule the follow-up appointment.     Signed, Levi Aland, NP  08/10/2022 9:58 AM    [facility]

## 2022-08-10 ENCOUNTER — Encounter: Payer: Self-pay | Admitting: Nurse Practitioner

## 2022-08-10 ENCOUNTER — Ambulatory Visit: Payer: Medicare PPO | Attending: Nurse Practitioner | Admitting: Nurse Practitioner

## 2022-08-10 VITALS — BP 132/64 | HR 72 | Ht 69.5 in | Wt 173.0 lb

## 2022-08-10 DIAGNOSIS — I471 Supraventricular tachycardia, unspecified: Secondary | ICD-10-CM

## 2022-08-10 DIAGNOSIS — E785 Hyperlipidemia, unspecified: Secondary | ICD-10-CM | POA: Diagnosis not present

## 2022-08-10 DIAGNOSIS — I10 Essential (primary) hypertension: Secondary | ICD-10-CM | POA: Diagnosis not present

## 2022-08-10 LAB — COMPREHENSIVE METABOLIC PANEL
ALT: 20 IU/L (ref 0–44)
AST: 35 IU/L (ref 0–40)
Albumin/Globulin Ratio: 2.3 — ABNORMAL HIGH (ref 1.2–2.2)
Albumin: 4.8 g/dL (ref 3.8–4.8)
Alkaline Phosphatase: 87 IU/L (ref 44–121)
BUN/Creatinine Ratio: 13 (ref 10–24)
BUN: 12 mg/dL (ref 8–27)
Bilirubin Total: 0.3 mg/dL (ref 0.0–1.2)
CO2: 26 mmol/L (ref 20–29)
Calcium: 9.9 mg/dL (ref 8.6–10.2)
Chloride: 102 mmol/L (ref 96–106)
Creatinine, Ser: 0.93 mg/dL (ref 0.76–1.27)
Globulin, Total: 2.1 g/dL (ref 1.5–4.5)
Glucose: 86 mg/dL (ref 70–99)
Potassium: 4 mmol/L (ref 3.5–5.2)
Sodium: 142 mmol/L (ref 134–144)
Total Protein: 6.9 g/dL (ref 6.0–8.5)
eGFR: 87 mL/min/{1.73_m2} (ref >59)

## 2022-08-10 LAB — LIPID PANEL
Chol/HDL Ratio: 2.6 ratio (ref 0.0–5.0)
Cholesterol, Total: 133 mg/dL (ref 100–199)
HDL: 52 mg/dL (ref >39)
LDL Chol Calc (NIH): 63 mg/dL (ref 0–99)
Triglycerides: 94 mg/dL (ref 0–149)
VLDL Cholesterol Cal: 18 mg/dL (ref 5–40)

## 2022-08-10 MED ORDER — ROSUVASTATIN CALCIUM 10 MG PO TABS: 10.0000 mg | ORAL_TABLET | Freq: Every day | ORAL | 3 refills | Status: DC

## 2022-08-10 MED ORDER — METOPROLOL TARTRATE 25 MG PO TABS: 12.5000 mg | ORAL_TABLET | ORAL | 6 refills | Status: DC | PRN

## 2022-08-10 MED ORDER — POTASSIUM CHLORIDE ER 10 MEQ PO TBCR: 10.0000 meq | EXTENDED_RELEASE_TABLET | Freq: Every day | ORAL | 3 refills | Status: DC

## 2022-08-10 MED ORDER — HYDROCHLOROTHIAZIDE 12.5 MG PO CAPS: 12.5000 mg | ORAL_CAPSULE | Freq: Every day | ORAL | 3 refills | Status: DC

## 2022-08-10 NOTE — Patient Instructions (Signed)
Medication Instructions:   Your physician recommends that you continue on your current medications as directed. Please refer to the Current Medication list given to you today.   *If you need a refill on your cardiac medications before your next appointment, please call your pharmacy*   Lab Work:  TODAY!!!!! CMET/LIPID  If you have labs (blood work) drawn today and your tests are completely normal, you will receive your results only by: [place] (if you have MyChart) OR A paper copy in the mail If you have any lab test that is abnormal or we need to change your treatment, we will call you to review the results.   Testing/Procedures:  None ordered.   Follow-Up: At [facility], you and your health needs are our priority.  As part of our continuing mission to provide you with exceptional heart care, we have created designated Provider Care Teams.  These Care Teams include your primary Cardiologist (physician) and Advanced Practice Providers (APPs -  Physician Assistants and Nurse Practitioners) who all work together to provide you with the care you need, when you need it.  We recommend signing up for the patient portal called "MyChart".  Sign up information is provided on this After Visit Summary.  MyChart is used to connect with patients for Virtual Visits (Telemedicine).  Patients are able to view lab/test results, encounter notes, upcoming appointments, etc.  Non-urgent messages can be sent to your provider as well.   To learn more about what you can do with MyChart, go to [email or web address].    Your next appointment:   1 month(s)  Provider:   Christen Bame, NP         Other Instructions  Your physician wants you to follow-up in: 1 year with Lorenda Peck.  You will receive a reminder letter in the mail two months in advance. If you don't receive a letter, please call our office to schedule the follow-up appointment.

## 2023-08-23 NOTE — Progress Notes (Unsigned)
Cardiology Office Note:  .   Date:  08/24/2023  ID:  Jonathan Ramirez, DOB Nov 18, 1950, MRN [id number] PCP: Lewis Moccasin, MD  [facility] Providers Cardiologist:  Lesleigh Noe, MD (Inactive) {  History of Present Illness: .   Jonathan Ramirez is a 73 y.o. male with  a PMH of   Hypertension  Hyperlipidemia  BPH Palpitations Monitor 07/2020: Long RP Tachycardia (likely sinus tachy); short SVT  Last cardiology clinic visit was 06/02/2021 with Dr. Katrinka Blazing at which time he reported metoprolol had "smoothed" out spontaneous episodes of tachycardia and he had discontinued metoprolol due to feeling no recurrent episodes. EKG felt to have less T wave abnormality than prior tracing. He had no specific cardiac symptoms and was advised to return for 1 year follow-up.   When he was last seen he reportedly felt well.  Had not been active recently due to taking care of his great granddaughter who is almost 27 years old.  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  He denies chest pain or shortness of breath.  No lower extremity edema, fatigue, palpitations, melena, hematuria, hemoptysis, diaphoresis, weakness, presyncope or syncope.  No orthopnea or PND.  He was not certain why he stopped taking his metoprolol but he thinks he may have ran out of this medication.  No episodes of tachycardia or palpitations for some time.  Today, he presents with a history of palpitations and high cholesterol for a routine check-up. He reports that he has been experiencing palpitations, which were previously managed with metoprolol. However, the patient has stopped taking metoprolol due to a decrease in physical activity and an increase in stress related to caring for a young great-granddaughter. The patient also mentions that he has stopped taking rosuvastatin for high cholesterol, which may need to be restarted.  The patient also discusses a previous issue with a urology clinic over a billing dispute,  which led to him stopping tamsulosin for an enlarged prostate. He also mentions having pre-cancer cells in the prostate, but it is unclear if this is a current issue or a past diagnosis  Reports no shortness of breath nor dyspnea on exertion. Reports no chest pain, pressure, or tightness. No edema, orthopnea, PND.   Discussed the use of AI scribe software for clinical note transcription with the patient, who gave verbal consent to proceed.  ROS: pertinent ROS in HPI  Studies Reviewed: Marland Kitchen   EKG Interpretation Date/Time:  Thursday August 24 2023 08:01:26 EST Ventricular Rate:  70 PR Interval:  182 QRS Duration:  82 QT Interval:  366 QTC Calculation: 395 R Axis:   84  Text Interpretation: Normal sinus rhythm Minimal voltage criteria for LVH, may be normal variant ( Sokolow-Lyon ) T wave abnormality, consider inferior ischemia When compared with ECG of 27-Aug-2020 13:46, No significant change was found Confirmed by Jari Favre [phone]) on 08/24/2023 8:21:08 AM    Zio monitor 07/21/20  Patch Wear Time:  13 days and 16 hours (2021-12-28T15:31:46-0500 to 2022-01-11T08:25:18-0500)   Patient had a min HR of 52 bpm, max HR of 235 bpm, and avg HR of 83 bpm. Predominant underlying rhythm was Sinus Rhythm. 5 Supraventricular Tachycardia runs occurred, the run with the fastest interval lasting 17 beats with a max rate of 235 bpm (avg 207  bpm); the run with the fastest interval was also the longest. Isolated SVEs were rare (<1.0%), SVE Couplets were rare (<1.0%), and SVE Triplets were rare (<1.0%). Isolated VEs were rare (<1.0%),  and no VE Couplets or VE Triplets were present.        Predominant rhythm is sinus Several episodes of long RP tachycardia are noted.  These could be atrial tachycardia vs sinus tachycardia  Physical Exam:   VS:  BP 130/86   Pulse 70   Ht 5' 9.5" (1.765 m)   Wt 181 lb 12.8 oz (82.5 kg)   SpO2 98%   BMI 26.46 kg/m    Wt Readings from Last 3 Encounters:  08/24/23 181  lb 12.8 oz (82.5 kg)  08/10/22 173 lb (78.5 kg)  06/02/21 164 lb (74.4 kg)    GEN: Well nourished, well developed in no acute distress NECK: No JVD; No carotid bruits CARDIAC: RRR, no murmurs, rubs, gallops RESPIRATORY:  Clear to auscultation without rales, wheezing or rhonchi  ABDOMEN: Soft, non-tender, non-distended EXTREMITIES:  No edema; No deformity   ASSESSMENT AND PLAN: .    Palpitations Patient reports palpitations while at rest, improved with as-needed use of Metoprolol. Currently not exercising due to caregiving responsibilities. -Continue Metoprolol as needed for palpitations.  Hyperlipidemia Patient has been non-compliant with Rosuvastatin due to running out of medication. No current symptoms related to hyperlipidemia. -Resume Rosuvastatin 10mg  daily. -Order lipid panel and liver function tests in a few weeks to assess response to medication.  Hypertension Patient is currently on HCTZ 12.5mg . -Continue HCTZ 12.5mg  daily.  Benign Prostatic Hyperplasia Patient has stopped taking Tamsulosin due to a billing issue with the urologist's office. Reports no current urinary symptoms. -Monitor for urinary symptoms and consider restarting Tamsulosin if symptoms recur.       Dispo: He can follow-up in a year.  Signed, Sharlene Dory, PA-C

## 2023-08-24 ENCOUNTER — Encounter: Payer: Self-pay | Admitting: Physician Assistant

## 2023-08-24 ENCOUNTER — Ambulatory Visit: Payer: Medicare PPO | Attending: Physician Assistant | Admitting: Physician Assistant

## 2023-08-24 VITALS — BP 130/86 | HR 70 | Ht 69.5 in | Wt 181.8 lb

## 2023-08-24 DIAGNOSIS — I10 Essential (primary) hypertension: Secondary | ICD-10-CM

## 2023-08-24 DIAGNOSIS — I471 Supraventricular tachycardia, unspecified: Secondary | ICD-10-CM | POA: Diagnosis not present

## 2023-08-24 DIAGNOSIS — E785 Hyperlipidemia, unspecified: Secondary | ICD-10-CM

## 2023-08-24 MED ORDER — ROSUVASTATIN CALCIUM 10 MG PO TABS: 10.0000 mg | ORAL_TABLET | Freq: Every day | ORAL | 3 refills | Status: AC

## 2023-08-24 MED ORDER — METOPROLOL TARTRATE 25 MG PO TABS: 12.5000 mg | ORAL_TABLET | ORAL | 3 refills | Status: AC | PRN

## 2023-08-24 MED ORDER — HYDROCHLOROTHIAZIDE 12.5 MG PO CAPS: 12.5000 mg | ORAL_CAPSULE | Freq: Every day | ORAL | 3 refills | Status: AC

## 2023-08-24 MED ORDER — POTASSIUM CHLORIDE ER 10 MEQ PO TBCR: 10.0000 meq | EXTENDED_RELEASE_TABLET | Freq: Every day | ORAL | 3 refills | Status: AC

## 2023-08-24 NOTE — Patient Instructions (Signed)
Medication Instructions:  Refills sent to [facility] as requested.  *If you need a refill on your cardiac medications before your next appointment, please call your pharmacy*   Lab Work: Fasting Lipid and LFT due in one month. Lab slip given to patient.  If you have labs (blood work) drawn today and your tests are completely normal, you will receive your results only by: MyChart Message (if you have MyChart) OR A paper copy in the mail If you have any lab test that is abnormal or we need to change your treatment, we will call you to review the results.   Follow-Up: At [facility], you and your health needs are our priority.  As part of our continuing mission to provide you with exceptional heart care, we have created designated Provider Care Teams.  These Care Teams include your primary Cardiologist (physician) and Advanced Practice Providers (APPs -  Physician Assistants and Nurse Practitioners) who all work together to provide you with the care you need, when you need it.  We recommend signing up for the patient portal called "MyChart".  Sign up information is provided on this After Visit Summary.  MyChart is used to connect with patients for Virtual Visits (Telemedicine).  Patients are able to view lab/test results, encounter notes, upcoming appointments, etc.  Non-urgent messages can be sent to your provider as well.   To learn more about what you can do with MyChart, go to [email or web address].    Your next appointment:   1 year(s)  Provider:   Jari Favre, PA-C       Other Instructions This office will be moving in April, 2025. You will receive a letter from [facility] with the directions to our new [facility] on [address] in [place] next to [facility].
